# Patient Record
Sex: Female | Born: 1965
Health system: Southern US, Community
[De-identification: ages and names within clinical notes are randomized; demographics above are authoritative.]

## PROBLEM LIST (undated history)

## (undated) DIAGNOSIS — D219 Benign neoplasm of connective and other soft tissue, unspecified: Secondary | ICD-10-CM

## (undated) DIAGNOSIS — H9190 Unspecified hearing loss, unspecified ear: Secondary | ICD-10-CM

## (undated) HISTORY — DX: Benign neoplasm of connective and other soft tissue, unspecified: D21.9

## (undated) HISTORY — PX: DILATION AND CURETTAGE OF UTERUS: SHX78

## (undated) HISTORY — DX: Unspecified hearing loss, unspecified ear: H91.90

## (undated) HISTORY — PX: HYSTEROSCOPY: SHX211

## (undated) HISTORY — PX: PELVIC LAPAROSCOPY: SHX162

---

## 1998-09-02 ENCOUNTER — Ambulatory Visit (HOSPITAL_COMMUNITY): Admission: RE | Admit: 1998-09-02 | Discharge: 1998-09-02 | Payer: Self-pay | Admitting: Neurosurgery

## 1998-09-02 ENCOUNTER — Encounter: Payer: Self-pay | Admitting: Neurosurgery

## 1999-02-25 ENCOUNTER — Other Ambulatory Visit: Admission: RE | Admit: 1999-02-25 | Discharge: 1999-02-25 | Payer: Self-pay | Admitting: Obstetrics and Gynecology

## 2000-02-23 ENCOUNTER — Other Ambulatory Visit: Admission: RE | Admit: 2000-02-23 | Discharge: 2000-02-23 | Payer: Self-pay | Admitting: Obstetrics and Gynecology

## 2001-01-10 ENCOUNTER — Encounter: Payer: Self-pay | Admitting: Family Medicine

## 2001-01-10 ENCOUNTER — Encounter: Admission: RE | Admit: 2001-01-10 | Discharge: 2001-01-10 | Payer: Self-pay | Admitting: Family Medicine

## 2001-02-23 ENCOUNTER — Other Ambulatory Visit: Admission: RE | Admit: 2001-02-23 | Discharge: 2001-02-23 | Payer: Self-pay | Admitting: Obstetrics and Gynecology

## 2001-04-06 ENCOUNTER — Encounter: Payer: Self-pay | Admitting: Obstetrics and Gynecology

## 2001-04-06 ENCOUNTER — Ambulatory Visit (HOSPITAL_COMMUNITY): Admission: RE | Admit: 2001-04-06 | Discharge: 2001-04-06 | Payer: Self-pay | Admitting: Obstetrics and Gynecology

## 2001-06-18 ENCOUNTER — Encounter (INDEPENDENT_AMBULATORY_CARE_PROVIDER_SITE_OTHER): Payer: Self-pay | Admitting: *Deleted

## 2001-06-18 ENCOUNTER — Inpatient Hospital Stay (HOSPITAL_COMMUNITY): Admission: RE | Admit: 2001-06-18 | Discharge: 2001-06-20 | Payer: Self-pay | Admitting: Obstetrics and Gynecology

## 2001-06-18 HISTORY — PX: EXPLORATORY LAPAROTOMY: SUR591

## 2001-06-18 HISTORY — PX: TONSILLECTOMY: SUR1361

## 2002-04-05 ENCOUNTER — Other Ambulatory Visit: Admission: RE | Admit: 2002-04-05 | Discharge: 2002-04-05 | Payer: Self-pay | Admitting: Obstetrics and Gynecology

## 2003-04-10 ENCOUNTER — Other Ambulatory Visit: Admission: RE | Admit: 2003-04-10 | Discharge: 2003-04-10 | Payer: Self-pay | Admitting: Obstetrics and Gynecology

## 2003-12-31 ENCOUNTER — Ambulatory Visit (HOSPITAL_COMMUNITY): Admission: RE | Admit: 2003-12-31 | Discharge: 2003-12-31 | Payer: Self-pay | Admitting: Gynecology

## 2003-12-31 ENCOUNTER — Encounter (INDEPENDENT_AMBULATORY_CARE_PROVIDER_SITE_OTHER): Payer: Self-pay | Admitting: Specialist

## 2003-12-31 ENCOUNTER — Ambulatory Visit (HOSPITAL_BASED_OUTPATIENT_CLINIC_OR_DEPARTMENT_OTHER): Admission: RE | Admit: 2003-12-31 | Discharge: 2003-12-31 | Payer: Self-pay | Admitting: Gynecology

## 2004-04-20 ENCOUNTER — Other Ambulatory Visit: Admission: RE | Admit: 2004-04-20 | Discharge: 2004-04-20 | Payer: Self-pay | Admitting: Gynecology

## 2004-09-22 ENCOUNTER — Ambulatory Visit (HOSPITAL_COMMUNITY): Admission: RE | Admit: 2004-09-22 | Discharge: 2004-09-22 | Payer: Self-pay | Admitting: Gynecology

## 2004-09-22 ENCOUNTER — Encounter (INDEPENDENT_AMBULATORY_CARE_PROVIDER_SITE_OTHER): Payer: Self-pay | Admitting: Specialist

## 2004-09-22 ENCOUNTER — Ambulatory Visit (HOSPITAL_BASED_OUTPATIENT_CLINIC_OR_DEPARTMENT_OTHER): Admission: RE | Admit: 2004-09-22 | Discharge: 2004-09-22 | Payer: Self-pay | Admitting: Gynecology

## 2004-12-02 ENCOUNTER — Encounter (INDEPENDENT_AMBULATORY_CARE_PROVIDER_SITE_OTHER): Payer: Self-pay | Admitting: *Deleted

## 2004-12-02 ENCOUNTER — Ambulatory Visit (HOSPITAL_COMMUNITY): Admission: RE | Admit: 2004-12-02 | Discharge: 2004-12-02 | Payer: Self-pay | Admitting: Gynecology

## 2004-12-02 ENCOUNTER — Ambulatory Visit (HOSPITAL_BASED_OUTPATIENT_CLINIC_OR_DEPARTMENT_OTHER): Admission: RE | Admit: 2004-12-02 | Discharge: 2004-12-02 | Payer: Self-pay | Admitting: Gynecology

## 2005-04-15 ENCOUNTER — Ambulatory Visit (HOSPITAL_COMMUNITY): Admission: RE | Admit: 2005-04-15 | Discharge: 2005-04-15 | Payer: Self-pay | Admitting: Gynecology

## 2005-05-05 ENCOUNTER — Emergency Department (HOSPITAL_COMMUNITY): Admission: EM | Admit: 2005-05-05 | Discharge: 2005-05-05 | Payer: Self-pay | Admitting: Family Medicine

## 2005-06-16 ENCOUNTER — Other Ambulatory Visit: Admission: RE | Admit: 2005-06-16 | Discharge: 2005-06-16 | Payer: Self-pay | Admitting: Gynecology

## 2006-03-08 ENCOUNTER — Ambulatory Visit (HOSPITAL_COMMUNITY): Admission: RE | Admit: 2006-03-08 | Discharge: 2006-03-08 | Payer: Self-pay | Admitting: Obstetrics and Gynecology

## 2006-06-27 ENCOUNTER — Other Ambulatory Visit: Admission: RE | Admit: 2006-06-27 | Discharge: 2006-06-27 | Payer: Self-pay | Admitting: Gynecology

## 2006-06-28 ENCOUNTER — Encounter: Admission: RE | Admit: 2006-06-28 | Discharge: 2006-06-28 | Payer: Self-pay | Admitting: Gynecology

## 2006-12-14 ENCOUNTER — Encounter: Admission: RE | Admit: 2006-12-14 | Discharge: 2007-03-14 | Payer: Self-pay | Admitting: Orthopaedic Surgery

## 2007-01-11 ENCOUNTER — Other Ambulatory Visit: Admission: RE | Admit: 2007-01-11 | Discharge: 2007-01-11 | Payer: Self-pay | Admitting: Gynecology

## 2007-08-28 ENCOUNTER — Other Ambulatory Visit: Admission: RE | Admit: 2007-08-28 | Discharge: 2007-08-28 | Payer: Self-pay | Admitting: Gynecology

## 2007-12-04 ENCOUNTER — Encounter: Admission: RE | Admit: 2007-12-04 | Discharge: 2007-12-04 | Payer: Self-pay | Admitting: Family Medicine

## 2007-12-18 ENCOUNTER — Encounter: Admission: RE | Admit: 2007-12-18 | Discharge: 2007-12-18 | Payer: Self-pay | Admitting: Family Medicine

## 2008-01-21 ENCOUNTER — Ambulatory Visit (HOSPITAL_COMMUNITY): Admission: RE | Admit: 2008-01-21 | Discharge: 2008-01-21 | Payer: Self-pay | Admitting: Obstetrics and Gynecology

## 2008-02-01 ENCOUNTER — Emergency Department (HOSPITAL_COMMUNITY): Admission: EM | Admit: 2008-02-01 | Discharge: 2008-02-01 | Payer: Self-pay | Admitting: Family Medicine

## 2008-08-24 ENCOUNTER — Encounter: Admission: RE | Admit: 2008-08-24 | Discharge: 2008-08-24 | Payer: Self-pay | Admitting: Otolaryngology

## 2008-10-17 ENCOUNTER — Other Ambulatory Visit: Admission: RE | Admit: 2008-10-17 | Discharge: 2008-10-17 | Payer: Self-pay | Admitting: Gynecology

## 2008-10-17 ENCOUNTER — Encounter: Payer: Self-pay | Admitting: Gynecology

## 2008-10-17 ENCOUNTER — Ambulatory Visit: Payer: Self-pay | Admitting: Gynecology

## 2008-12-10 ENCOUNTER — Encounter: Admission: RE | Admit: 2008-12-10 | Discharge: 2008-12-10 | Payer: Self-pay | Admitting: Family Medicine

## 2008-12-15 ENCOUNTER — Emergency Department (HOSPITAL_COMMUNITY): Admission: EM | Admit: 2008-12-15 | Discharge: 2008-12-15 | Payer: Self-pay | Admitting: Family Medicine

## 2009-12-17 ENCOUNTER — Other Ambulatory Visit: Admission: RE | Admit: 2009-12-17 | Discharge: 2009-12-17 | Payer: Self-pay | Admitting: Gynecology

## 2009-12-17 ENCOUNTER — Ambulatory Visit: Payer: Self-pay | Admitting: Gynecology

## 2010-01-21 ENCOUNTER — Encounter: Admission: RE | Admit: 2010-01-21 | Discharge: 2010-01-21 | Payer: Self-pay | Admitting: Family Medicine

## 2011-01-02 ENCOUNTER — Encounter: Payer: Self-pay | Admitting: Gynecology

## 2011-01-05 ENCOUNTER — Other Ambulatory Visit: Payer: Self-pay | Admitting: *Deleted

## 2011-01-05 DIAGNOSIS — Z1239 Encounter for other screening for malignant neoplasm of breast: Secondary | ICD-10-CM

## 2011-01-18 ENCOUNTER — Other Ambulatory Visit (HOSPITAL_COMMUNITY)
Admission: RE | Admit: 2011-01-18 | Discharge: 2011-01-18 | Disposition: A | Discharge status: Home / Self Care | Payer: 59 | Source: Ambulatory Visit | Attending: Gynecology | Admitting: Gynecology

## 2011-01-18 ENCOUNTER — Other Ambulatory Visit: Payer: Self-pay | Admitting: Gynecology

## 2011-01-18 ENCOUNTER — Encounter: Payer: 59 | Admitting: Gynecology

## 2011-01-18 DIAGNOSIS — Z01419 Encounter for gynecological examination (general) (routine) without abnormal findings: Secondary | ICD-10-CM

## 2011-01-18 DIAGNOSIS — Z124 Encounter for screening for malignant neoplasm of cervix: Secondary | ICD-10-CM | POA: Insufficient documentation

## 2011-01-24 ENCOUNTER — Ambulatory Visit
Admission: RE | Admit: 2011-01-24 | Discharge: 2011-01-24 | Disposition: A | Discharge status: Home / Self Care | Payer: 59 | Source: Ambulatory Visit | Attending: *Deleted | Admitting: *Deleted

## 2011-01-24 DIAGNOSIS — Z1239 Encounter for other screening for malignant neoplasm of breast: Secondary | ICD-10-CM

## 2011-04-14 ENCOUNTER — Other Ambulatory Visit: Payer: Self-pay | Admitting: Family Medicine

## 2011-04-14 DIAGNOSIS — Z1239 Encounter for other screening for malignant neoplasm of breast: Secondary | ICD-10-CM

## 2011-04-26 NOTE — Op Note (Signed)
NAMEAVAH, BASHOR                 ACCOUNT NO.:  [id number]   MEDICAL RECORD NO.:  [id number]          PATIENT TYPE:  AMB   LOCATION:  SDC                           FACILITY:  [facility]   PHYSICIAN:  Fermin Schwab, MD   DATE OF BIRTH:  1966-09-25   DATE OF PROCEDURE:  01/21/2008  DATE OF DISCHARGE:                               OPERATIVE REPORT   PREOPERATIVE DIAGNOSIS:  Endometrial polyp.   POSTOPERATIVE DIAGNOSIS:  Endometrial polyp plus intrauterine adhesion.   PROCEDURE:  1. Hysteroscopy.  2. Polypectomy.  3. Lysis of adhesion.   SURGEON:  Fermin Schwab, MD   ANESTHESIA:  General LMA plus paracervical block.   FINDINGS:  The cervical canal was normal.  Endometrial canal appears  somewhat asymmetric due to dense but small adhesions in the left osteal  region of the fundus.  The right tubal osteum was seen, but the left  tubal osteum was not visualized even after lysis of the left side of the  adhesion.  There was a posterior 4 x 4 mm polypoid mass on the  endometrium.  The uterus sounded to 7 cm.   DESCRIPTION OF PROCEDURE:  The patient was placed in the lithotomy  position.  LMA anesthesia was started.  Kefzol 1 g was given  intravenously for prophylaxis.  The patient was prepped and draped in a  sterile manner.  Using 3% sorbitol and a fluid distention pump set at 80  mmHg pressure, video hysteroscopy was started, following paracervical  block with Lidocaine 1% and dilute (0.4 U/mL) vasopressin injection into  the cervix.  A slender 5mm hysteroscope was used.  Above findings were  noted.  No dilation was required.  Using hysteroscopic scissors, the  left fundal dense adhesion was lysed.  In spite of this, the left tubal  ostium could not be visualized (please note, this was the area of  previous hysteroscopic resection of the myoma).  The right tubal ostium  was noted.  Using hysteroscopic scissors, the posterior polypoid region  was also carved out, and a specimen was  submitted for pathology.  Estimated blood loss was less than 10 cc.  The fluid deficit was 150 cc.  The patient tolerated the procedure well and was transferred to the  recovery room in satisfactory condition.      Fermin Schwab, MD  Electronically Signed     TY/MEDQ  D:  01/21/2008  T:  01/22/2008  Job:  [id number]

## 2011-04-29 NOTE — Op Note (Signed)
Becky Brown, Becky Brown                 ACCOUNT NO.:  [id number]   MEDICAL RECORD NO.:  [id number]          PATIENT TYPE:  AMB   LOCATION:  N[facility]C                         FACILITY:  [facility]   PHYSICIAN:  Juan H. Lily Peer, M.D.DATE OF BIRTH:  Nov 27, 1966   DATE OF PROCEDURE:  09/22/2004  DATE OF DISCHARGE:                                 OPERATIVE REPORT   PREOPERATIVE DIAGNOSIS:  First trimester missed AB.   POSTOPERATIVE DIAGNOSIS:  First trimester missed abortion.   PROCEDURE:  Dilatation and evacuation.   SURGEON:  Juan H. Lily Peer, M.D.   ANESTHESIA:  MAC and paracervical block with 2% Xylocaine with 1:100,000  epinephrine.   INDICATIONS FOR PROCEDURE:  A 45 year old gravida 3, para 0, now AB 3 with  history of infertility and recurrent pregnancy losses.  Patient with first  trimester missed AB.   DESCRIPTION OF PROCEDURE:  After the patient was adequately counseled, she  was taken to the operating room where she underwent a successful IV  sedation. The vagina and perineum were prepped and draped in the usual  sterile fashion. The patient had previously voided right before coming to  the operating room.  Examination under anesthesia demonstrated an 8 weeks'  size anteverted uterus but no palpable masses.  A Graves' speculum was  inserted into the vaginal vault. The vagina and cervix were cleansed with  Betadine solution.  The patient had received 1 g of Mefoxin for prophylaxis.  Then 2% Xylocaine with 1:100,000 epinephrine was infiltrated into the  cervical stroma at the 2, 4, 8 and 10 o'clock positions.   The cervix was serially dilated to a size 21 Pratt dilator.  The uterus  sounded to approximately 8 cm. An 8 mm suction curet was introduced into the  intrauterine cavity for removal of the intrauterine products of conception  and this was interchanged with a Hunter curet to completely evacuate the  intrauterine cavity. The single-tooth tenaculum was removed.  The patient  tolerated the procedure well with no complications and was transferred to  recovery room with stable vital signs.  Blood loss was minimal and fluid  resuscitation consisted of 500 cc of lactated Ringer's.  The patient's blood  type is B positive.      JHF/MEDQ  D:  09/22/2004  T:  09/22/2004  Job:  [id number]

## 2011-04-29 NOTE — H&P (Signed)
NAMEDEMETRIA, IWAI                 ACCOUNT NO.:  [id number]   MEDICAL RECORD NO.:  [id number]          PATIENT TYPE:  AMB   LOCATION:  NESC                         FACILITY:  [facility]   PHYSICIAN:  Juan H. Lily Peer, M.D.DATE OF BIRTH:  30-Mar-1966   DATE OF ADMISSION:  DATE OF DISCHARGE:                                HISTORY & PHYSICAL   She is scheduled for surgery at [facility] tomorrow,  December the 22nd, at 9:30 a.m.  Please have history and physical available.   CHIEF COMPLAINT:  1.  Recurrent pregnancy loss.  2.  Suspected endometrial polyps/submucous myoma.   HISTORY:  The patient is a 45 year old, gravida 3, para 0, AB 3, who in  October of this year underwent a D&E for a first trimester missed AB.  On  November 28th she was seen in the office where she had a sonohysterogram as  part of her evaluation for recurrent pregnancy losses.  She had a prior  myomectomy in the past in 2002.  The patient recently had antiphospholipid  antibodies, IgG and IgM which were tested.  They were in the normal range.  Lupus anticoagulant was slightly in the upper limits of normal at 44.1  seconds, cut off again at 40.5 seconds.  The sonohysterogram on the 10th day  of her cycle demonstrated uterus measuring 6.3 x 3.0 x 4.0 cm with an  endometrial stripe of 10.1 mm.  Two myomas measuring 9 x 9 mm, 10 x 12 mm  were noted and the right ovary was noted as well as the left ovary.  No  fluid in the cul-de-sacs.  sonohysterogram demonstrated no filling defect,  but an encroaching myoma posterior wall measuring 1.0 cm.  I explained to  Rosaly that perhaps the problem with her recurrent pregnancy loss may be an  impingement from these myomas and that she would benefit from a diagnostic  hysteroscopy and possible resectoscopic myomectomy in an effort to improve  the implantation site for future pregnancies.  The patient has been on  Megace 20 mg b.i.d. for two weeks in an effort to thin  out the endometrium  for better visualization during the hysteroscopic procedure.  She also had a  laminaria placed endocervically today on December the 21st in the office,  where she underwent a preoperative consultation and risks, benefits and pros  and cons were discussed.   PAST MEDICAL HISTORY:  1.  The patient is allergic to ERYTHROMYCIN.  2.  History of fibroid uterus with prior history of myomectomy.  3.  Patient with prior history of tonsillectomy in the past and had been on      multivitamins.   FAMILY HISTORY:  Lung cancer in her father, history of grandmother with  diabetes and grandmother with hypertension and breast cancer in maternal  grandmother.   PHYSICAL EXAMINATION:  GENERAL:  Well-developed, well-nourished female.  HEENT:  Unremarkable.  NECK:  Supple.  Trachea midline.  No carotid bruits.  No thyromegaly.  LUNGS:  Clear to auscultation without rhonchi or wheezes.  HEART:  Regular rate and rhythm.  No murmurs or gallops.  BREASTS:  Not done.  ABDOMEN:  Soft, nontender.  No rebound or guarding.  PELVIC:  Bartholin, urethral, Skene gland within normal limits.  Vagina and  cervix no lesions or discharge.  Uterus slightly anteverted, normal size.  __________ consistency.  Adnexa without any palpable mass or tenderness.  RECTAL:  Not done.   ASSESSMENT:  This is a 45 year old, gravida 3, para 0, now AB 3, with  history of recurrent pregnancy losses with what appears to be impingement of  the endometrial cavity from several myomas.  She was scheduled to undergo a  diagnostic hysteroscopy with possible resection of part of or complete  submucous myomectomy in an effort to improve her pregnancy success next  time.  The patient's husband also suffers from retrograde ejaculation, for  which he has been under the care of Dr. Morrison Old at [facility].  The patient had a laminaria placed endocervically today in the office where  we had discussed the risks,  benefits and pros and cons of surgery to include  infection and she will receive prophylaxis antibiotic.  The risks of uterine  perforation from instrumentation were discussed as well, as well as fluid  overload and pulmonary edema from the descending media.  All of these issues  were discussed.  All questions were answered and we will follow accordingly.   PLAN:  The patient is scheduled for diagnostic hysteroscopy and possible  resectoscopic polypectomy/myomectomy tomorrow, December 22nd at [facility].     Juan   JHF/MEDQ  D:  12/01/2004  T:  12/01/2004  Job:  [id number]

## 2011-04-29 NOTE — H&P (Signed)
NAME:  Becky Brown, Becky Brown NO.:  [id number]   MEDICAL RECORD NO.:  [id number]                   PATIENT TYPE:   LOCATION:                                       FACILITY:   PHYSICIAN:  Juan H. Lily Peer, M.D.             DATE OF BIRTH:   DATE OF ADMISSION:  01/31/2004  DATE OF DISCHARGE:                                HISTORY & PHYSICAL   CHIEF COMPLAINT:  First trimester missed abortion.   HISTORY:  The patient is a 45 year old gravida I, para 0, AB 1 who presented  to the office today at [facility] as a new OB and also for an  ultrasound due to the fact that she had had long standing history of  infertility whereby she had had IUI and Clomid here in our office and  subsequently had been followed with Dr. Joylene Grapes at [facility] where she had undergone IUI, Clomid, and HCG and  had conceived after that one cycle. He had done an ultrasound in [facility], and on December 22 whereby two yolk sacs were seen but only heart  beat and thought perhaps that the other one had not developed enough to be  able to detect a heart beat. Based on her last menstrual period and timing  of the IUI (last menstrual period November 1), she is currently [redacted] weeks  gestation with estimated date of confinement of August 8. She had denied any  history of any vaginal bleeding, and unfortunately today on the ultrasound  in the office at [facility] on January 17, it demonstrated  nonviable intrauterine pregnancy with a blighted ovum, questionable three  amnions visualized of various dimensions, one measuring 23 mm, another one  measuring 19 mm, and another 11 mm. All the amnions were empty. No fetal  pole was noted, and no cardiac activity. Based on this finding, the patient  was counseled and recommended to proceed with a D&E at this point, and the  risks and benefits and pros and cons were discussed.   PAST MEDICAL HISTORY:   The patient is allergic to ERYTHROMYCIN. History of  fibroid uterus with prior history of myomectomy. She has also had  tonsillectomy in the past as well. She is on multivitamins and Sudafed  p.r.n.   FAMILY HISTORY:  Lung cancer in her father and history of grandmother with  diabetes. Grandmother with hypertension and breast cancer in maternal  grandmother.   PHYSICAL EXAMINATION:  GENERAL:  The patient weighs 161 pounds, 5 feet 5-1/2  inches tall. Blood pressure 114/68.  HEENT:  Unremarkable.  NECK:  Supple. Trachea midline. No carotid bruits. No thyromegaly.  LUNGS:  Clear to auscultation without rhonchi or wheezes.  HEART:  Regular rate and rhythm with no murmurs or gallops.  BREAST:  Exam not done.  ABDOMEN:  Soft, nontender, without rebound or guarding.  PELVIC:  _________ within normal limits. Pelvic examination essentially  unremarkable. Uterus approximately 10 weeks' size. No palpable adnexal  masses.  RECTAL:  Exam deferred.   ASSESSMENT:  A 45 year old now gravida 2, para 0, AB 1 with evidence of  first trimester missed AB is scheduled to undergo a D&E at [facility]. Risks, benefits, and pros and cons of the procedure were  discussed to include infection, bleeding, trauma to internal organs,  perforation, and a D&E were discussed. The patient will receive a gram of  cefepime for prophylaxis. All of the above risks were discussed with the  patient. All questions were answered.   PLAN:  The patient scheduled for first trimester D&E at [facility] on Wednesday, January 19, at 8:30 a.m. The patient's blood type is B  positive.                                               Juan H. Lily Peer, M.D.    JHF/MEDQ  D:  12/30/2003  T:  12/30/2003  Job:  [id number]

## 2011-04-29 NOTE — H&P (Signed)
[facility]  Patient:    Becky Brown, Becky Brown                         MRN: [id number] Adm. Date:  06/18/01 Attending:  Rande Brunt. Eda Paschal, M.D.                         History and Physical  CHIEF COMPLAINT:  Menometrorrhagia with leiomyoma uteri.  HISTORY OF PRESENT ILLNESS:  The patient is a 45 year old, gravida 1, para 0, AB 1, who presented to the office in April with a history of persistent menorrhagia, menometrorrhagia, and severe dysmenorrhea in spite of a variety of different oral contraceptives in order to control it.  On exam she has multiple myomas, the largest of which is 5 cm and appears to encroach on the endometrial cavity.  There is also some question that she actually has intracavitary pathology.  As a result of the above and also the fact that at some point the patient would like to have children, she enters the hospital now for multiple myectomies, excision of endometriosis or other pathology found to treat the above.  She understands there is a small, but real risk of hysterectomy as a result of the surgery, and she is comfortable with accepting for the benefits.  PAST MEDICAL HISTORY:  T&A as a child.  MEDICATIONS:  Septra DS and Demulen.  ALLERGIES:  ERYTHROMYCIN.  FAMILY HISTORY:  Grandmother had breast cancer as well as diabetes. Grandmother and her mother have hypertension.  REVIEW OF SYSTEMS:  HEENT:  Negative.  CARDIAC:  Negative.  RESPIRATORY: Negative.  GI:  Negative.  GU:  Negative.  NEUROMUSCULAR:  Negative. ENDOCRINE:  Negative.  SOCIAL HISTORY:  She is an Charity fundraiser at [employer].  She has one to two glasses of wine per month.  She does not smoke.  She uses caffeine occasionally.  PHYSICAL EXAMINATION:  VITAL SIGNS:  Blood pressure is 116/74, pulse 80 and regular,  respirations 16 and nonlabored, and she is afebrile.  GENERAL:  The patient is a well-developed, well-nourished female in no acute distress.  HEENT:   Within normal limits.  NECK:  Supple, trachea in midline.  Thyroid is not enlarged.  LUNGS:  Clear to auscultation and percussion.  HEART:  No thrills, heaves or murmurs.  BREASTS:  No masses.  ABDOMEN:  Soft without guarding, rebound, or masses.  PELVIC:  External and vaginal within normal limits.  Cervix is clean.  Uterus is enlarged by multiple myoma to approximately 8-10 week size.  Adnexa palpably normal.  Rectal is negative.  EXTREMITIES:  Within normal limits.  X-RAYS:  Please note on HSG the patient does not have an intracavitary lesion per se, although there appears to be something encroaching on it.  On ultrasound she has multiple myomas, the largest is 5 cm, and she has three additional that are between 2 and 3 cm.   IMPRESSION:  Menometrorrhagia and dysmenorrhea with myomas.  PLAN:  Myomectomy. DD:  06/18/01 TD:  06/18/01 Job: [id number]

## 2011-04-29 NOTE — H&P (Signed)
NAMEDELIAH, Becky Brown                 ACCOUNT NO.:  [id number]   MEDICAL RECORD NO.:  [id number]          PATIENT TYPE:  AMB   LOCATION:  NESC                         FACILITY:  [facility]   PHYSICIAN:  Juan H. Lily Peer, M.D.DATE OF BIRTH:  [birth year]   DATE OF ADMISSION:  DATE OF DISCHARGE:                                HISTORY & PHYSICAL   CHIEF COMPLAINT:  First trimester missed AB.   HISTORY:  The patient is a 45 year old, gravida 3, para 0, now AB 3, who had  conceived with this pregnancy as a result of IUI and Clomid and had been  followed by Dr. Morrison Old at [facility].  The patient had been seen in the office in [facility] for follow  up ultrasound after her conception, and she had an ultrasound September 7,  which demonstrated a viable intrauterine pregnancy at 7 weeks and with a  single viable intrauterine pregnancy, and a yolk sac was seen.  She  subsequently returned back to the office complaining of some vaginal  spotting on October 7 and unfortunately, there was evidence of a missed AB.  No cardiac activity was noted, [redacted] weeks gestational sac, and small corpus  luteum cyst on the left ovary measuring 15 x 11 mm.  The patient had more of  a brownish discharge.  She had been on progesterone suppository for luteal  support during her first pregnancy as well as with this one but since the  serum progesterone level was done in Dr. Meridee Score office and was in the  normal range, she discontinued the serum progesterone level.  The patient is  scheduled to undergo a D&E tomorrow, October 12, at [facility].   PAST MEDICAL HISTORY:  1.  The patient is allergic to ERYTHROMYCIN.  2.  History of fibroid uterus with prior history of myomectomy.  3.  She has had a tonsillectomy in the past, and she had been on      multivitamins.   FAMILY HISTORY:  Lung cancer in her father and history of grandmother with  diabetes.  Grandmother  with hypertension and breast cancer in a maternal  grandmother.   PHYSICAL EXAMINATION:  GENERAL:  Well-developed, well-nourished female with  the above-mentioned complaint.  HEENT:  Unremarkable.  NECK:  Supple.  Trachea midline.  No carotid bruits.  No thyromegaly.  LUNGS:  Clear to auscultation without rhonchi or wheezes.  HEART:  Regular rate and rhythm.  No murmurs or gallops.  BREAST:  Exam not done.  ABDOMEN:  Soft, nontender, without rebound or guarding.  PELVIC EXAM:  Bartholin, urethra, Skene's within normal limits.  Vagina and  cervix with brownish discharge noted in the vaginal vault.  Cervix not  dilated.  Uterus approximately 8 weeks' size.  RECTAL EXAM:  Not done.   ASSESSMENT:  A 45 year old gravida 3, para 0, now AB 3, with first trimester  missed AB.  Based on last menstrual period, the patient would be  approximately [redacted] weeks gestation.  Based on pelvic exam, 8-10 weeks' size  uterus and on ultrasound, a  nonviable fetus at 7-1/[redacted] weeks gestation was  noted.  The patient scheduled to undergo a D&E at [facility]  tomorrow, October 12.  Risks, benefits, pros and cons discussed to include  infection, bleeding, trauma to internal organ, perforation of the uterus,  risk of deep venous thrombosis, pulmonary embolism, and emergency  exploratory laparotomy and hysterectomy were also discussed as potential  complication from the procedure.  The patient will then be followed up in  the office where she will need to have continued evaluation now of recurrent  pregnancy losses besides her infertility issues.  They are well-delineated  in the patient's record at [facility].  The patient's  blood type is B positive.  All questions are answered, and we will follow  accordingly.   PLAN:  The patient scheduled for D&E, tomorrow, October 12, at [facility].  Please have history and physical available.      JHF/MEDQ  D:   09/21/2004  T:  09/21/2004  Job:  [id number]

## 2011-04-29 NOTE — Discharge Summary (Signed)
[facility]  Patient:    Becky Brown, Becky Brown                         MRN: [id number] Adm. Date:  [id number] Disc. Date: 06/20/01 Attending:  Sharon Mt                           Discharge Summary  HISTORY OF PRESENT ILLNESS AND HOSPITAL COURSE:  The patient is a 45 year old female who is admitted to the hospital with symptomatic fibroids and menorrhagia for multiple myomectomies.  On the day of admission multiple myomectomies were performed.  By the second postoperative day she was voiding well, tolerating oral pain medications, and although she had not passed gas, had a flat abdomen and appeared to be on the verge of doing so.  She was discharged on full liquids to advance as tolerated.  She was given Tylox for pain relief.  She will return to the office in two days for staple removal. Final pathology report revealed multiple benign leiomyoma.  CONDITION ON DISCHARGE:  Improved.  DISCHARGE DIAGNOSES: 1. Multiple myomas. 2. Menorrhagia.  OPERATIONS:  Multiple myomectomies. DD:  06/20/01 TD:  06/20/01 Job: [id number]

## 2011-04-29 NOTE — Op Note (Signed)
NAMEJASMEN, Becky Brown                            ACCOUNT NO.:  [id number]   MEDICAL RECORD NO.:  [id number]                   PATIENT TYPE:  AMB   LOCATION:  NESC                                 FACILITY:  [facility]   PHYSICIAN:  Juan H. Lily Peer, M.D.             DATE OF BIRTH:  Apr 11, 1966   DATE OF PROCEDURE:  12/31/2003  DATE OF DISCHARGE:                                 OPERATIVE REPORT   SURGEON:  Juan H. Lily Peer, M.D.   INDICATION FOR OPERATION:  A 45 year old, gravida 1, para 0, now AB1with  evidence of first trimester missed AB.   PREOPERATIVE DIAGNOSIS:  First trimester missed AB.   POSTOPERATIVE DIAGNOSIS:  First trimester missed AB.   ANESTHESIA:  MAC and paracervical block, consisting of 2% lidocaine.   PROCEDURE PERFORMED:  D&E.   FINDINGS:  Exam under anesthesia demonstrated a 10 week size uterus with no  palpable adnexal masses.   DESCRIPTION OF OPERATION:  After the patient was adequately counseled, she  was taken to the operating room where she underwent successful IV sedation.  She was placed in low lithotomy position.  The vagina and perineum were  prepped and draped in the usual sterile fashion.  A red rubber Roxan Hockey was  inserted in an effort to evacuate her bladder contents for approximately 50  mL.  Xylocaine 2% was then infiltrated into the cervical stroma at the 2, 4,  8, and 10 o'clock position.  The cervix was dilated with a Pratt dilator to  a size 25, and a 10 mm suction curette was introduced into the intrauterine  cavity to remove the remaining products of conception.  This was  interchanged with a suction curette.  An aliquot of the sample was submitted  for chromosomal studies.  The single-tooth tenaculum was removed.  Silver  nitrate was placed at the area where the prongs were because of a little bit  of bleeding but was contained without any difficulty.  The patient was  transferred to recovery room with stable vital signs, and she received 1  g  of Cefotan prophylactically, and she received 30 mg of Toradol en route to  the recovery room.  Blood loss was minimal.  Fluid resuscitation consisted  of approximately 900 mL of lactated Ringer's.                                               Juan H. Lily Peer, M.D.    JHF/MEDQ  D:  12/31/2003  T:  12/31/2003  Job:  [id number]

## 2011-04-29 NOTE — Op Note (Signed)
[facility]  Patient:    Becky Brown, Becky Brown                         MRN: [id number] Proc. Date: 06/18/01 Adm. Date:  [id number] Attending:  Sharon Mt                           Operative Report  PREOPERATIVE DIAGNOSIS:  Multiple myomas with dysfunctional uterine bleeding and dysmenorrhea.  POSTOPERATIVE DIAGNOSIS:  Multiple myomas with dysfunctional uterine bleeding and dysmenorrhea.  OPERATION:  Exploratory laparotomy with multiple myomectomy.  SURGEON:  Daniel L. Eda Paschal, M.D.  FIRST ASSISTANT:  Katy Fitch, M.D.  ANESTHESIA:  General endotracheal.  FINDINGS:  Patient had approximately seven myomas.  They started with two submucous myomas of less than 0.5 cm.  There was one that was in the wall of the uterus of approximately 2.5 to 3.0 cm that abutted the endometrial cavity. There were several that were more subserosal or almost pedunculated, one of which was in very close approximation to the left fallopian tube; these were approximately 4 and 5 cm each.  The fallopian tubes, ovaries, pelvic peritoneum were free of any disease and there was no endometriosis specifically.  The fimbriae of the tubes looked normal and they were luxuriant.  DESCRIPTION OF PROCEDURE:  After adequate general endotracheal anesthesia, the patient was placed in supine position, prepped and draped in the usual sterile manner, a Foley catheter was inserted in the patients bladder and a Jarcho cannula was inserted into the uterus to inject dye.  A Pfannenstiel incision was made, the fascia was opened transverse, the peritoneum was entered vertically, the subcutaneous bleeders were clamped and bovied as encountered. When the peritoneal cavity was opened, the above findings were noted.  An attempt was made to inject dye but there was a fair bit of clot in her uterus from her episode of menometrorrhagia and no dye could be seen leaving the tubes, although she had  had a normal HSG within the past month.  A dilute solution of Pitressin was injected and two incisions were needed to remove all the myomas.  The first one was in the midline of the uterus vertically, staying away from the fallopian tubes.  This was taken very deep into the myometrium and the large myoma that abutted the endometrial cavity was removed.  At this point, the endometrial cavity was then entered and the two submucous myomas, which were small, were removed and then this incision was closed.  The endometrium was reapproximated with 4-0 Vicryl, the myometrium was approximated with a running 0 Vicryl and the serosa of the uterus was closed with 4-0 Prolene.  The remainder three to four myomas were removed through a second incision at the top of the fundus, taking exceeding care to stay away from the tube, which could be done.  None of them were particular deep and there was no myometrium that had to be reapproximated, so the serosa was then closed with a running 4-0 Prolene.  Copious irrigation was done with Ringers lactate.  Two sponge, needle and instrument counts were correct.  The peritoneal cavity was closed with a running 0 Vicryl.  Prior to completely closing it, about 300 cc of Ringers lactate were placed to allow the pelvic structures to float until everything was healed.  The fascia was closed with two running 0 Vicryl and the skin was closed with staples.  Estimated  blood loss for the entire procedure was 300 cc with none replaced.  Patient tolerated procedure well and left the operating room in satisfactory condition, draining clear urine from her Foley catheter. DD:  06/18/01 TD:  06/18/01 Job: [id number]

## 2011-04-29 NOTE — Op Note (Signed)
Becky Brown, SUPAK                 ACCOUNT NO.:  [id number]   MEDICAL RECORD NO.:  [id number]          PATIENT TYPE:  AMB   LOCATION:  [facility]                         FACILITY:  [facility]   PHYSICIAN:  Juan H. Lily Peer, M.D.DATE OF BIRTH:  [birth year]   DATE OF PROCEDURE:  12/02/2004  DATE OF DISCHARGE:                                 OPERATIVE REPORT   A 45 year old gravida 3, para 0, AB 3 with a history of recurrent pregnancy  losses.  On sonohystogram, questionable endometrial polyp versus submucus  myoma.  Patient with prior history of abdominal myomectomy.   PREOPERATIVE DIAGNOSES:  1.  Recurrent pregnancy loss.  2.  Submucus myoma versus endometrial polyps.   POSTOPERATIVE DIAGNOSIS:  Endometrial polyp.   SURGEON:  Juan H. Lily Peer, M.D.   PROCEDURE PERFORMED:  1.  Diagnostic hysteroscopy.  2.  Resectoscopic polypectomy.   FINDINGS:  Multiple small polyps throughout the uterine cavity.  A small  area of where a Prolene suture was seen from previous myomectomy.  Right  tubal ostia was identified, and the left tubal ostia was identified after  removal of polyps.  Endocervical canal was smooth.   DESCRIPTION OF PROCEDURE:  After the patient was adequately counseled, she  was taken to the operating room, where she received 1 gm of Cefotan for  prophylaxis.  She underwent successful general endotracheal anesthesia.  Her  vagina and perineum were prepped and draped in the usual sterile fashion.  Bimanual examination demonstrated a slightly retroverted uterus.  She had  Laminaria placed the night before in an effort to facilitate insertion of  the operative hysteroscope today and requiring minimal cervical dilatation.  The uterus sounded to approximately 7 cm.  A single-tooth tenaculum was  placed on the anterior cervical lip, and the Lendell Caprice operative  resectoscope with a 90 degree wire loop was inserted into the intrauterine  cavity.  Sorbitol 3% was the distended media that  was utilized.  The  endocervical canal was smooth.  No complications on entering the  intrauterine cavity.  The right tubal ostia was identified.  The left tubal  ostial had several polyps near the upper one-third portion of the uterus,  which was resected.  A small area where a Prolene suture was seen in the  right anterior portion of the uterus where a prior myomectomy may have been  done.  After saving some of the polyps, there was some bleeding that was  individually cauterized.  The instrument was removed.  A 30 cc Foley  catheter was inserted into the intrauterine cavity but inflated to 20 cc to  allow for a tamponade effect into the uterus.  The single-tooth tenaculum  was removed.  The patient was extubated and transferred to the recovery room  for stable vital signs.  Fluid deficit from the distending media was 325 cc.  Blood loss was less than 100 cc.  IV fluids was 700 cc of lactated Ringer's.    Juan  JHF/MEDQ  D:  12/02/2004  T:  12/02/2004  Job:  [id number]

## 2011-09-02 LAB — URINALYSIS, ROUTINE W REFLEX MICROSCOPIC
Ketones, ur: NEGATIVE
Leukocytes, UA: NEGATIVE
Protein, ur: NEGATIVE
Urobilinogen, UA: 0.2
pH: 5.5

## 2011-09-02 LAB — CBC
HCT: 35.3 — ABNORMAL LOW
Hemoglobin: 12.2
MCHC: 34.6
MCV: 91.9
Platelets: 279
RDW: 13.2
WBC: 5.6

## 2011-09-02 LAB — URINE MICROSCOPIC-ADD ON

## 2012-01-12 DIAGNOSIS — H905 Unspecified sensorineural hearing loss: Secondary | ICD-10-CM | POA: Insufficient documentation

## 2012-01-12 DIAGNOSIS — H903 Sensorineural hearing loss, bilateral: Secondary | ICD-10-CM | POA: Insufficient documentation

## 2012-01-12 DIAGNOSIS — H9319 Tinnitus, unspecified ear: Secondary | ICD-10-CM | POA: Insufficient documentation

## 2012-02-10 ENCOUNTER — Other Ambulatory Visit: Payer: Self-pay | Admitting: Gynecology

## 2012-02-10 DIAGNOSIS — Z1231 Encounter for screening mammogram for malignant neoplasm of breast: Secondary | ICD-10-CM

## 2012-02-16 ENCOUNTER — Ambulatory Visit
Admission: RE | Admit: 2012-02-16 | Discharge: 2012-02-16 | Disposition: A | Discharge status: Home / Self Care | Payer: 59 | Source: Ambulatory Visit | Attending: Gynecology | Admitting: Gynecology

## 2012-02-16 DIAGNOSIS — Z1231 Encounter for screening mammogram for malignant neoplasm of breast: Secondary | ICD-10-CM

## 2012-03-28 ENCOUNTER — Encounter: Payer: 59 | Admitting: Gynecology

## 2012-04-04 ENCOUNTER — Ambulatory Visit (INDEPENDENT_AMBULATORY_CARE_PROVIDER_SITE_OTHER): Payer: 59 | Admitting: Gynecology

## 2012-04-04 ENCOUNTER — Encounter: Payer: Self-pay | Admitting: Gynecology

## 2012-04-04 VITALS — BP 128/88 | Ht 65.75 in | Wt 192.0 lb

## 2012-04-04 DIAGNOSIS — Z01419 Encounter for gynecological examination (general) (routine) without abnormal findings: Secondary | ICD-10-CM

## 2012-04-04 DIAGNOSIS — R635 Abnormal weight gain: Secondary | ICD-10-CM

## 2012-04-04 NOTE — Patient Instructions (Signed)
Exercise to Lose Weight Exercise and a healthy diet may help you lose weight. Your doctor may suggest specific exercises. EXERCISE IDEAS AND TIPS  Choose low-cost things you enjoy doing, such as walking, bicycling, or exercising to workout videos.   Take stairs instead of the elevator.   Walk during your lunch break.   Park your car further away from work or school.   Go to a gym or an exercise class.   Start with 5 to 10 minutes of exercise each day. Build up to 30 minutes of exercise 4 to 6 days a week.   Wear shoes with good support and comfortable clothes.   Stretch before and after working out.   Work out until you breathe harder and your heart beats faster.   Drink extra water when you exercise.   Do not do so much that you hurt yourself, feel dizzy, or get very short of breath.  Exercises that burn about 150 calories:  Running 1  miles in 15 minutes.   Playing volleyball for 45 to 60 minutes.   Washing and waxing a car for 45 to 60 minutes.   Playing touch football for 45 minutes.   Walking 1  miles in 35 minutes.   Pushing a stroller 1  miles in 30 minutes.   Playing basketball for 30 minutes.   Raking leaves for 30 minutes.   Bicycling 5 miles in 30 minutes.   Walking 2 miles in 30 minutes.   Dancing for 30 minutes.   Shoveling snow for 15 minutes.   Swimming laps for 20 minutes.   Walking up stairs for 15 minutes.   Bicycling 4 miles in 15 minutes.   Gardening for 30 to 45 minutes.   Jumping rope for 15 minutes.   Washing windows or floors for 45 to 60 minutes.  Document Released: 12/31/2010 Document Revised: 08/10/2011 Document Reviewed: 12/31/2010 [employer] Patient Information 2012 [employer].                                                  Cholesterol Control Diet  Cholesterol levels in your body are determined significantly by your diet. Cholesterol levels may also be related to heart disease. The following material helps to  explain this relationship and discusses what you can do to help keep your heart healthy. Not all cholesterol is bad. Low-density lipoprotein (LDL) cholesterol is the "bad" cholesterol. It may cause fatty deposits to build up inside your arteries. High-density lipoprotein (HDL) cholesterol is "good." It helps to remove the "bad" LDL cholesterol from your blood. Cholesterol is a very important risk factor for heart disease. Other risk factors are high blood pressure, smoking, stress, heredity, and weight. The heart muscle gets its supply of blood through the coronary arteries. If your LDL cholesterol is high and your HDL cholesterol is low, you are at risk for having fatty deposits build up in your coronary arteries. This leaves less room through which blood can flow. Without sufficient blood and oxygen, the heart muscle cannot function properly and you may feel chest pains (angina pectoris). When a coronary artery closes up entirely, a part of the heart muscle may die, causing a heart attack (myocardial infarction). CHECKING CHOLESTEROL When your caregiver sends your blood to a lab to be analyzed for cholesterol, a complete lipid (fat) profile may be done. With   this test, the total amount of cholesterol and levels of LDL and HDL are determined. Triglycerides are a type of fat that circulates in the blood and can also be used to determine heart disease risk. The list below describes what the numbers should be: Test: Total Cholesterol.  Less than 200 mg/dl.  Test: LDL "bad cholesterol."  Less than 100 mg/dl.   Less than 70 mg/dl if you are at very high risk of a heart attack or sudden cardiac death.  Test: HDL "good cholesterol."  Greater than 50 mg/dl for women.   Greater than 40 mg/dl for men.  Test: Triglycerides.  Less than 150 mg/dl.  CONTROLLING CHOLESTEROL WITH DIET Although exercise and lifestyle factors are important, your diet is key. That is because certain foods are known to raise  cholesterol and others to lower it. The goal is to balance foods for their effect on cholesterol and more importantly, to replace saturated and trans fat with other types of fat, such as monounsaturated fat, polyunsaturated fat, and omega-3 fatty acids. On average, a person should consume no more than 15 to 17 g of saturated fat daily. Saturated and trans fats are considered "bad" fats, and they will raise LDL cholesterol. Saturated fats are primarily found in animal products such as meats, butter, and cream. However, that does not mean you need to sacrifice all your favorite foods. Today, there are good tasting, low-fat, low-cholesterol substitutes for most of the things you like to eat. Choose low-fat or nonfat alternatives. Choose round or loin cuts of red meat, since these types of cuts are lowest in fat and cholesterol. Chicken (without the skin), fish, veal, and ground turkey breast are excellent choices. Eliminate fatty meats, such as hot dogs and salami. Even shellfish have little or no saturated fat. Have a 3 oz (85 g) portion when you eat lean meat, poultry, or fish. Trans fats are also called "partially hydrogenated oils." They are oils that have been scientifically manipulated so that they are solid at room temperature resulting in a longer shelf life and improved taste and texture of foods in which they are added. Trans fats are found in stick margarine, some tub margarines, cookies, crackers, and baked goods.  When baking and cooking, oils are an excellent substitute for butter. The monounsaturated oils are especially beneficial since it is believed they lower LDL and raise HDL. The oils you should avoid entirely are saturated tropical oils, such as coconut and palm.  Remember to eat liberally from food groups that are naturally free of saturated and trans fat, including fish, fruit, vegetables, beans, grains (barley, rice, couscous, bulgur wheat), and pasta (without cream sauces).  IDENTIFYING  FOODS THAT LOWER CHOLESTEROL  Soluble fiber may lower your cholesterol. This type of fiber is found in fruits such as apples, vegetables such as broccoli, potatoes, and carrots, legumes such as beans, peas, and lentils, and grains such as barley. Foods fortified with plant sterols (phytosterol) may also lower cholesterol. You should eat at least 2 g per day of these foods for a cholesterol lowering effect.  Read package labels to identify low-saturated fats, trans fats free, and low-fat foods at the supermarket. Select cheeses that have only 2 to 3 g saturated fat per ounce. Use a heart-healthy tub margarine that is free of trans fats or partially hydrogenated oil. When buying baked goods (cookies, crackers), avoid partially hydrogenated oils. Breads and muffins should be made from whole grains (whole-wheat or whole oat flour, instead of "flour" or "  enriched flour"). Buy non-creamy canned soups with reduced salt and no added fats.  FOOD PREPARATION TECHNIQUES  Never deep-fry. If you must fry, either stir-fry, which uses very little fat, or use non-stick cooking sprays. When possible, broil, bake, or roast meats, and steam vegetables. Instead of dressing vegetables with butter or margarine, use lemon and herbs, applesauce and cinnamon (for squash and sweet potatoes), nonfat yogurt, salsa, and low-fat dressings for salads.  LOW-SATURATED FAT / LOW-FAT FOOD SUBSTITUTES Meats / Saturated Fat (g)  Avoid: Steak, marbled (3 oz/85 g) / 11 g   Choose: Steak, lean (3 oz/85 g) / 4 g   Avoid: Hamburger (3 oz/85 g) / 7 g   Choose: Hamburger, lean (3 oz/85 g) / 5 g   Avoid: Ham (3 oz/85 g) / 6 g   Choose: Ham, lean cut (3 oz/85 g) / 2.4 g   Avoid: Chicken, with skin, dark meat (3 oz/85 g) / 4 g   Choose: Chicken, skin removed, dark meat (3 oz/85 g) / 2 g   Avoid: Chicken, with skin, light meat (3 oz/85 g) / 2.5 g   Choose: Chicken, skin removed, light meat (3 oz/85 g) / 1 g  Dairy / Saturated Fat  (g)  Avoid: Whole milk (1 cup) / 5 g   Choose: Low-fat milk, 2% (1 cup) / 3 g   Choose: Low-fat milk, 1% (1 cup) / 1.5 g   Choose: Skim milk (1 cup) / 0.3 g   Avoid: Hard cheese (1 oz/28 g) / 6 g   Choose: Skim milk cheese (1 oz/28 g) / 2 to 3 g   Avoid: Cottage cheese, 4% fat (1 cup) / 6.5 g   Choose: Low-fat cottage cheese, 1% fat (1 cup) / 1.5 g   Avoid: Ice cream (1 cup) / 9 g   Choose: Sherbet (1 cup) / 2.5 g   Choose: Nonfat frozen yogurt (1 cup) / 0.3 g   Choose: Frozen fruit bar / trace   Avoid: Whipped cream (1 tbs) / 3.5 g   Choose: Nondairy whipped topping (1 tbs) / 1 g  Condiments / Saturated Fat (g)  Avoid: Mayonnaise (1 tbs) / 2 g   Choose: Low-fat mayonnaise (1 tbs) / 1 g   Avoid: Butter (1 tbs) / 7 g   Choose: Extra light margarine (1 tbs) / 1 g   Avoid: Coconut oil (1 tbs) / 11.8 g   Choose: Olive oil (1 tbs) / 1.8 g   Choose: Corn oil (1 tbs) / 1.7 g   Choose: Safflower oil (1 tbs) / 1.2 g   Choose: Sunflower oil (1 tbs) / 1.4 g   Choose: Soybean oil (1 tbs) / 2.4 g   Choose: Canola oil (1 tbs) / 1 g  Document Released: 11/28/2005 Document Revised: 08/10/2011 Document Reviewed: 05/19/2011 [employer] Patient Information 2012 [employer].   

## 2012-04-04 NOTE — Progress Notes (Signed)
Becky Brown [birth year] [id number]   History:    46 y.o.  for annual exam with no complaints reported. Review of her record indicated that she has gained 3 pounds from last year and has a BMI of 31.23. Patient her mammogram in March of this year which was normal and she frequently does her self breast examination. Dr. Manus Gunning history internist and has done her lab work within the past year. She's not using any form of contraception is having normal menstrual cycles. Past medical history,surgical history, family history and social history were all reviewed and documented in the EPIC chart.  Gynecologic History Patient's last menstrual period was 03/26/2012. Contraception: none Last Pap: 2012. Results were: normal Last mammogram: 2013. Results were: normal  Obstetric History OB History    Grav Para Term Preterm Abortions TAB SAB Ect Mult Living   3 0   3  3   0     # Outc Date GA Lbr Len/2nd Wgt Sex Del Anes PTL Lv   1 SAB            2 SAB            3 SAB                ROS:  Was performed and pertinent positives and negatives are included in the history.  Exam: chaperone present  BP 128/88  Ht 5' 5.75" (1.67 m)  Wt 192 lb (87.091 kg)  BMI 31.23 kg/m2  LMP 03/26/2012  Body mass index is 31.23 kg/(m^2).  General appearance : Well developed well nourished female. No acute distress HEENT: Neck supple, trachea midline, no carotid bruits, no thyroidmegaly Lungs: Clear to auscultation, no rhonchi or wheezes, or rib retractions  Heart: Regular rate and rhythm, no murmurs or gallops Breast:Examined in sitting and supine position were symmetrical in appearance, no palpable masses or tenderness,  no skin retraction, no nipple inversion, no nipple discharge, no skin discoloration, no axillary or supraclavicular lymphadenopathy Abdomen: no palpable masses or tenderness, no rebound or guarding Extremities: no edema or skin discoloration or tenderness  Pelvic:  Bartholin, Urethra, Skene  Glands: Within normal limits             Vagina: No gross lesions or discharge  Cervix: No gross lesions or discharge  Uterus  anteverted, normal size, shape and consistency, non-tender and mobile  Adnexa  Without masses or tenderness  Anus and perineum  normal   Rectovaginal  normal sphincter tone without palpated masses or tenderness             Hemoccult not done     Assessment/Plan:  46 y.o. female for annual exam who was encouraged to do her monthly self breast examination. We discussed importance of calcium vitamin D for osteoporosis prevention as well as weightbearing exercises. She stated that she was 40% of her hearing in her right ear after the H1 in 1 vaccine. We discussed the new Pap tear screening guidelines and she will not need 1 for 2 more years. Her labs were being drawn by her primary physician so no lab work will be drawn today.    Ok Edwards MD, 12:02 PM 04/04/2012

## 2013-02-25 ENCOUNTER — Other Ambulatory Visit: Payer: Self-pay

## 2013-02-25 DIAGNOSIS — Z1231 Encounter for screening mammogram for malignant neoplasm of breast: Secondary | ICD-10-CM

## 2013-03-15 ENCOUNTER — Ambulatory Visit
Admission: RE | Admit: 2013-03-15 | Discharge: 2013-03-15 | Disposition: A | Discharge status: Home / Self Care | Payer: 59 | Source: Ambulatory Visit

## 2013-03-15 DIAGNOSIS — Z1231 Encounter for screening mammogram for malignant neoplasm of breast: Secondary | ICD-10-CM

## 2013-04-18 ENCOUNTER — Other Ambulatory Visit (HOSPITAL_COMMUNITY)
Admission: RE | Admit: 2013-04-18 | Discharge: 2013-04-18 | Disposition: A | Discharge status: Home / Self Care | Payer: 59 | Source: Ambulatory Visit | Attending: Gynecology | Admitting: Gynecology

## 2013-04-18 ENCOUNTER — Ambulatory Visit (INDEPENDENT_AMBULATORY_CARE_PROVIDER_SITE_OTHER): Payer: 59 | Admitting: Gynecology

## 2013-04-18 ENCOUNTER — Encounter: Payer: Self-pay | Admitting: Gynecology

## 2013-04-18 VITALS — BP 130/86 | Ht 64.5 in | Wt 192.0 lb

## 2013-04-18 DIAGNOSIS — Z01419 Encounter for gynecological examination (general) (routine) without abnormal findings: Secondary | ICD-10-CM

## 2013-04-18 DIAGNOSIS — N951 Menopausal and female climacteric states: Secondary | ICD-10-CM

## 2013-04-18 DIAGNOSIS — N949 Unspecified condition associated with female genital organs and menstrual cycle: Secondary | ICD-10-CM

## 2013-04-18 DIAGNOSIS — Z1151 Encounter for screening for human papillomavirus (HPV): Secondary | ICD-10-CM | POA: Insufficient documentation

## 2013-04-18 DIAGNOSIS — N938 Other specified abnormal uterine and vaginal bleeding: Secondary | ICD-10-CM

## 2013-04-18 LAB — FOLLICLE STIMULATING HORMONE: FSH: 38.6 m[IU]/mL

## 2013-04-18 NOTE — Patient Instructions (Addendum)
Endometrial Biopsy This is a test in which a tissue sample (a biopsy) is taken from inside the uterus (womb). It is then looked at by a specialist under a microscope to see if the tissue is normal or abnormal. The endometrium is the lining of the uterus. This test helps determine where you are in your menstrual cycle and how hormone levels are affecting the lining of the uterus. Another use for this test is to diagnose endometrial cancer, tuberculosis, polyps, or inflammatory conditions and to evaluate uterine bleeding. PREPARATION FOR TEST No preparation or fasting is necessary. NORMAL FINDINGS No pathologic conditions. Presence of "secretory-type" endometrium 3 to 5 days before to normal menstruation. Ranges for normal findings may vary among different laboratories and hospitals. You should always check with your doctor after having lab work or other tests done to discuss the meaning of your test results and whether your values are considered within normal limits. MEANING OF TEST  Your caregiver will go over the test results with you and discuss the importance and meaning of your results, as well as treatment options and the need for additional tests if necessary. OBTAINING THE TEST RESULTS It is your responsibility to obtain your test results. Ask the lab or department performing the test when and how you will get your results. Document Released: 03/31/2005 Document Revised: 02/20/2012 Document Reviewed: 11/07/2008 [employer] Patient Information 2013 [employer].  

## 2013-04-18 NOTE — Progress Notes (Signed)
Becky Brown August 01, 1966 [id number]   History:    47 y.o.  for annual gyn exam with a complaint of having had 2 cycles in the month of April. Patient has not been sexually active since September 2013. Patient is also complaining of some vasomotor symptoms such as hot flashes but very infrequent. Review of patient's records indicated that 15 years ago she had an abnormal Pap smear and had cryotherapy. Subsequent Pap smears have been normal. Patient's Tdap vaccine is up-to-date. Patient with past history of recurrent pregnancy losses.  Past medical history,surgical history, family history and social history were all reviewed and documented in the EPIC chart.  Gynecologic History Patient's last menstrual period was 04/02/2013. Contraception: none Last Pap: 2012. Results were: normal Last mammogram: 2014. Results were: normal  Obstetric History OB History   Grav Para Term Preterm Abortions TAB SAB Ect Mult Living   3 0   3  3   0     # Outc Date GA Lbr Len/2nd Wgt Sex Del Anes PTL Lv   1 SAB            2 SAB            3 SAB                ROS: A ROS was performed and pertinent positives and negatives are included in the history.  GENERAL: No fevers or chills. HEENT: No change in vision, no earache, sore throat or sinus congestion. NECK: No pain or stiffness. CARDIOVASCULAR: No chest pain or pressure. No palpitations. PULMONARY: No shortness of breath, cough or wheeze. GASTROINTESTINAL: No abdominal pain, nausea, vomiting or diarrhea, melena or bright red blood per rectum. GENITOURINARY: No urinary frequency, urgency, hesitancy or dysuria. MUSCULOSKELETAL: No joint or muscle pain, no back pain, no recent trauma. DERMATOLOGIC: No rash, no itching, no lesions. ENDOCRINE: No polyuria, polydipsia, no heat or cold intolerance. No recent change in weight. HEMATOLOGICAL: No anemia or easy bruising or bleeding. NEUROLOGIC: No headache, seizures, numbness, tingling or weakness. PSYCHIATRIC: No depression,  no loss of interest in normal activity or change in sleep pattern.     Exam: chaperone present  BP 130/86  Ht 5' 4.5" (1.638 m)  Wt 192 lb (87.091 kg)  BMI 32.46 kg/m2  LMP 04/02/2013  Body mass index is 32.46 kg/(m^2).  General appearance : Well developed well nourished female. No acute distress HEENT: Neck supple, trachea midline, no carotid bruits, no thyroidmegaly Lungs: Clear to auscultation, no rhonchi or wheezes, or rib retractions  Heart: Regular rate and rhythm, no murmurs or gallops Breast:Examined in sitting and supine position were symmetrical in appearance, no palpable masses or tenderness,  no skin retraction, no nipple inversion, no nipple discharge, no skin discoloration, no axillary or supraclavicular lymphadenopathy Abdomen: no palpable masses or tenderness, no rebound or guarding Extremities: no edema or skin discoloration or tenderness  Pelvic:  Bartholin, Urethra, Skene Glands: Within normal limits             Vagina: No gross lesions or discharge  Cervix: No gross lesions or discharge  Uterus  anteverted, normal size, shape and consistency, non-tender and mobile  Adnexa  Without masses or tenderness  Anus and perineum  normal   Rectovaginal  normal sphincter tone without palpated masses or tenderness             Hemoccult not indicated  Because of patient's age and to periods in the month of April we proceeded with doing an  endometrial biopsy after the patient was counseled. Her cervix was cleansed with Betadine solution. A single-tooth tenaculum was placed on the anterior cervical lip. A Pipelle was introduced into the intrauterine cavity after dilating her cervix. Tissue was obtained and submitted for histological evaluation. Pap smear was done prior to endometrial biopsy.  Assessment/Plan:  47 y.o. female for annual exam perimenopausal with dysfunctional uterine bleeding underwent endometrial biopsy today results pending at time of this dictation. Patient  will return to the office in one to 2 weeks for a sonohysterogram. Patient has had history in the past of endometrial polyps back in 2005 which were resected. Patient's primary physician Dr. Malachy Mood has been doing her lab work. We discussed importance of calcium vitamin D for osteoporosis prevention. Also reminded her of the importance of monthly self breast examination. We are going to also to check an [facility] today.    Ok Edwards MD, 10:24 AM 04/18/2013

## 2013-04-30 ENCOUNTER — Encounter: Payer: Self-pay | Admitting: Gynecology

## 2013-07-24 ENCOUNTER — Ambulatory Visit: Payer: 59 | Admitting: Gynecology

## 2013-07-24 ENCOUNTER — Other Ambulatory Visit: Payer: 59

## 2014-03-19 ENCOUNTER — Other Ambulatory Visit: Payer: Self-pay

## 2014-03-19 DIAGNOSIS — Z1231 Encounter for screening mammogram for malignant neoplasm of breast: Secondary | ICD-10-CM

## 2014-04-04 ENCOUNTER — Ambulatory Visit
Admission: RE | Admit: 2014-04-04 | Discharge: 2014-04-04 | Disposition: A | Discharge status: Home / Self Care | Payer: 59 | Source: Ambulatory Visit

## 2014-04-04 DIAGNOSIS — Z1231 Encounter for screening mammogram for malignant neoplasm of breast: Secondary | ICD-10-CM

## 2014-06-26 ENCOUNTER — Encounter: Payer: Self-pay | Admitting: Gynecology

## 2014-06-26 ENCOUNTER — Ambulatory Visit (INDEPENDENT_AMBULATORY_CARE_PROVIDER_SITE_OTHER): Payer: 59 | Admitting: Gynecology

## 2014-06-26 VITALS — BP 130/86 | Ht 65.0 in | Wt 179.0 lb

## 2014-06-26 DIAGNOSIS — N951 Menopausal and female climacteric states: Secondary | ICD-10-CM

## 2014-06-26 DIAGNOSIS — Z01419 Encounter for gynecological examination (general) (routine) without abnormal findings: Secondary | ICD-10-CM

## 2014-06-26 DIAGNOSIS — D251 Intramural leiomyoma of uterus: Secondary | ICD-10-CM

## 2014-06-26 NOTE — Progress Notes (Signed)
Becky Brown 1966/07/16 [id number]   History:    48 y.o.  for annual gyn exam with complaint of occasional hot flashes and her cycle sometimes delayed by a few days. Last year she had similar complaints and an [place] was done which was found to be in the menopausal range. Patient with past history of infertility and recurrent pregnancy losses. Patient using no contraception currently. Patient several years ago had history of dysplasia treated by cryo-and subsequent Pap smears have been normal. The patient in 2002 at abdominal myomectomy.  Past medical history,surgical history, family history and social history were all reviewed and documented in the EPIC chart.  Gynecologic History Patient's last menstrual period was 05/21/2014. Contraception: none Last Pap: 2014. Results were: normal Last mammogram: 2015. Results were: normal  Obstetric History OB History  Gravida Para Term Preterm AB SAB TAB Ectopic Multiple Living  3 0   3 3    0    # Outcome Date GA Lbr Len/2nd Weight Sex Delivery Anes PTL Lv  3 SAB           2 SAB           1 SAB                ROS: A ROS was performed and pertinent positives and negatives are included in the history.  GENERAL: No fevers or chills. HEENT: No change in vision, no earache, sore throat or sinus congestion. NECK: No pain or stiffness. CARDIOVASCULAR: No chest pain or pressure. No palpitations. PULMONARY: No shortness of breath, cough or wheeze. GASTROINTESTINAL: No abdominal pain, nausea, vomiting or diarrhea, melena or bright red blood per rectum. GENITOURINARY: No urinary frequency, urgency, hesitancy or dysuria. MUSCULOSKELETAL: No joint or muscle pain, no back pain, no recent trauma. DERMATOLOGIC: No rash, no itching, no lesions. ENDOCRINE: No polyuria, polydipsia, no heat or cold intolerance. No recent change in weight. HEMATOLOGICAL: No anemia or easy bruising or bleeding. NEUROLOGIC: No headache, seizures, numbness, tingling or weakness.  PSYCHIATRIC: No depression, no loss of interest in normal activity or change in sleep pattern.     Exam: chaperone present  BP 130/86  Ht 5\' 5"  (1.651 m)  Wt 179 lb (81.194 kg)  BMI 29.79 kg/m2  LMP 05/21/2014  Body mass index is 29.79 kg/(m^2).  General appearance : Well developed well nourished female. No acute distress HEENT: Neck supple, trachea midline, no carotid bruits, no thyroidmegaly Lungs: Clear to auscultation, no rhonchi or wheezes, or rib retractions  Heart: Regular rate and rhythm, no murmurs or gallops Breast:Examined in sitting and supine position were symmetrical in appearance, no palpable masses or tenderness,  no skin retraction, no nipple inversion, no nipple discharge, no skin discoloration, no axillary or supraclavicular lymphadenopathy Abdomen: no palpable masses or tenderness, no rebound or guarding Extremities: no edema or skin discoloration or tenderness  Pelvic:  Bartholin, Urethra, Skene Glands: Within normal limits             Vagina: No gross lesions or discharge  Cervix: No gross lesions or discharge  Uterus  anteverted, normal size, shape and consistency, non-tender and mobile  Adnexa  Without masses or tenderness  Anus and perineum  normal   Rectovaginal  normal sphincter tone without palpated masses or tenderness             Hemoccult not indicated     Assessment/Plan:  48 y.o. female for annual exam who appears to be perimenopausal period last year [facility] was  elevated we will repeat it again this year. I'm going to provide her with literature information on the menopause as well as hormone replacement therapy. She was reminded of the importance of calcium and vitamin D and regular exercise for osteoporosis prevention. Her primary doctor Dr. Haynes Hoehn has been doing her blood work.  Note: This dictation was prepared with  Dragon/digital dictation along withSmart phrase technology. Any transcriptional errors that result from this process are  unintentional.   Terrance Mass MD, 11:15 AM 06/26/2014

## 2014-06-26 NOTE — Patient Instructions (Signed)
Hormone Therapy At menopause, your body begins making less estrogen and progesterone hormones. This causes the body to stop having menstrual periods. This is because estrogen and progesterone hormones control your periods and menstrual cycle. A lack of estrogen may cause symptoms such as:  Hot flushes (or hot flashes).  Vaginal dryness.  Dry skin.  Loss of sex drive.  Risk of bone loss (osteoporosis). When this happens, you may choose to take hormone therapy to get back the estrogen lost during menopause. When the hormone estrogen is given alone, it is usually referred to as ET (Estrogen Therapy). When the hormone progestin is combined with estrogen, it is generally called HT (Hormone Therapy). This was formerly known as hormone replacement therapy (HRT). Your caregiver can help you make a decision on what will be best for you. The decision to use HT seems to change often as new studies are done. Many studies do not agree on the benefits of hormone replacement therapy. LIKELY BENEFITS OF HT INCLUDE PROTECTION FROM:  Hot Flushes (also called hot flashes) - A hot flush is a sudden feeling of heat that spreads over the face and body. The skin may redden like a blush. It is connected with sweats and sleep disturbance. Women going through menopause may have hot flushes a few times a month or several times per day depending on the woman.  Osteoporosis (bone loss)- Estrogen helps guard against bone loss. After menopause, a woman's bones slowly lose calcium and become weak and brittle. As a result, bones are more likely to break. The hip, wrist, and spine are affected most often. Hormone therapy can help slow bone loss after menopause. Weight bearing exercise and taking calcium with vitamin D also can help prevent bone loss. There are also medications that your caregiver can prescribe that can help prevent osteoporosis.  Vaginal Dryness - Loss of estrogen causes changes in the vagina. Its lining may  become thin and dry. These changes can cause pain and bleeding during sexual intercourse. Dryness can also lead to infections. This can cause burning and itching. (Vaginal estrogen treatment can help relieve pain, itching, and dryness.)  Urinary Tract Infections are more common after menopause because of lack of estrogen. Some women also develop urinary incontinence because of low estrogen levels in the vagina and bladder.  Possible other benefits of estrogen include a positive effect on mood and short-term memory in women. RISKS AND COMPLICATIONS  Using estrogen alone without progesterone causes the lining of the uterus to grow. This increases the risk of lining of the uterus (endometrial) cancer. Your caregiver should give another hormone called progestin if you have a uterus.  Women who take combined (estrogen and progestin) HT appear to have an increased risk of breast cancer. The risk appears to be small, but increases throughout the time that HT is taken.  Combined therapy also makes the breast tissue slightly denser which makes it harder to read mammograms (breast X-rays).  Combined, estrogen and progesterone therapy can be taken together every day, in which case there may be spotting of blood. HT therapy can be taken cyclically in which case you will have menstrual periods. Cyclically means HT is taken for a set amount of days, then not taken, then this process is repeated.  HT may increase the risk of stroke, heart attack, breast cancer and forming blood clots in your leg.  Transdermal estrogen (estrogen that is absorbed through the skin with a patch or a cream) may have more positive results with:    Cholesterol.  Blood pressure.  Blood clots. Having the following conditions may indicate you should not have HT:  Endometrial cancer.  Liver disease.  Breast cancer.  Heart disease.  History of blood clots.  Stroke. TREATMENT   If you choose to take HT and have a uterus,  usually estrogen and progestin are prescribed.  Your caregiver will help you decide the best way to take the medications.  Possible ways to take estrogen include:  Pills.  Patches.  Gels.  Sprays.  Vaginal estrogen cream, rings and tablets.  It is best to take the lowest dose possible that will help your symptoms and take them for the shortest period of time that you can.  Hormone therapy can help relieve some of the problems (symptoms) that affect women at menopause. Before making a decision about HT, talk to your caregiver about what is best for you. Be well informed and comfortable with your decisions. HOME CARE INSTRUCTIONS   Follow your caregivers advice when taking the medications.  A Pap test is done to screen for cervical cancer.  The first Pap test should be done at age 21.  Between ages 21 and 29, Pap tests are repeated every 2 years.  Beginning at age 30, you are advised to have a Pap test every 3 years as long as your past 3 Pap tests have been normal.  Some women have medical problems that increase the chance of getting cervical cancer. Talk to your caregiver about these problems. It is especially important to talk to your caregiver if a new problem develops soon after your last Pap test. In these cases, your caregiver may recommend more frequent screening and Pap tests.  The above recommendations are the same for women who have or have not gotten the vaccine for HPV (Human Papillomavirus).  If you had a hysterectomy for a problem that was not a cancer or a condition that could lead to cancer, then you no longer need Pap tests. However, even if you no longer need a Pap test, a regular exam is a good idea to make sure no other problems are starting.   If you are between ages 65 and 70, and you have had normal Pap tests going back 10 years, you no longer need Pap tests. However, even if you no longer need a Pap test, a regular exam is a good idea to make sure no  other problems are starting.   If you have had past treatment for cervical cancer or a condition that could lead to cancer, you need Pap tests and screening for cancer for at least 20 years after your treatment.  If Pap tests have been discontinued, risk factors (such as a new sexual partner) need to be re-assessed to determine if screening should be resumed.  Some women may need screenings more often if they are at high risk for cervical cancer.  Get mammograms done as per the advice of your caregiver. SEEK IMMEDIATE MEDICAL CARE IF:  You develop abnormal vaginal bleeding.  You have pain or swelling in your legs, shortness of breath, or chest pain.  You develop dizziness or headaches.  You have lumps or changes in your breasts or armpits.  You have slurred speech.  You develop weakness or numbness of your arms or legs.  You have pain, burning, or bleeding when urinating.  You develop abdominal pain. Document Released: 08/27/2003 Document Revised: 02/20/2012 Document Reviewed: 12/15/2010 ExitCare Patient Information 2015 [employer]. This information is not intended to   replace advice given to you by your health care provider. Make sure you discuss any questions you have with your health care provider. Perimenopause Perimenopause is the time when your body begins to move into the menopause (no menstrual period for 12 straight months). It is a natural process. Perimenopause can begin 2-8 years before the menopause and usually lasts for 1 year after the menopause. During this time, your ovaries may or may not produce an egg. The ovaries vary in their production of estrogen and progesterone hormones each month. This can cause irregular menstrual periods, difficulty getting pregnant, vaginal bleeding between periods, and uncomfortable symptoms. CAUSES  Irregular production of the ovarian hormones, estrogen and progesterone, and not ovulating every month.  Other causes  include:  Tumor of the pituitary gland in the brain.  Medical disease that affects the ovaries.  Radiation treatment.  Chemotherapy.  Unknown causes.  Heavy smoking and excessive alcohol intake can bring on perimenopause sooner. SIGNS AND SYMPTOMS   Hot flashes.  Night sweats.  Irregular menstrual periods.  Decreased sex drive.  Vaginal dryness.  Headaches.  Mood swings.  Depression.  Memory problems.  Irritability.  Tiredness.  Weight gain.  Trouble getting pregnant.  The beginning of losing bone cells (osteoporosis).  The beginning of hardening of the arteries (atherosclerosis). DIAGNOSIS  Your health care provider will make a diagnosis by analyzing your age, menstrual history, and symptoms. He or she will do a physical exam and note any changes in your body, especially your female organs. Female hormone tests may or may not be helpful depending on the amount of female hormones you produce and when you produce them. However, other hormone tests may be helpful to rule out other problems. TREATMENT  In some cases, no treatment is needed. The decision on whether treatment is necessary during the perimenopause should be made by you and your health care provider based on how the symptoms are affecting you and your lifestyle. Various treatments are available, such as:  Treating individual symptoms with a specific medicine for that symptom.  Herbal medicines that can help specific symptoms.  Counseling.  Group therapy. HOME CARE INSTRUCTIONS   Keep track of your menstrual periods (when they occur, how heavy they are, how long between periods, and how long they last) as well as your symptoms and when they started.  Only take over-the-counter or prescription medicines as directed by your health care provider.  Sleep and rest.  Exercise.  Eat a diet that contains calcium (good for your bones) and soy (acts like the estrogen hormone).  Do not smoke.  Avoid  alcoholic beverages.  Take vitamin supplements as recommended by your health care provider. Taking vitamin E may help in certain cases.  Take calcium and vitamin D supplements to help prevent bone loss.  Group therapy is sometimes helpful.  Acupuncture may help in some cases. SEEK MEDICAL CARE IF:   You have questions about any symptoms you are having.  You need a referral to a specialist (gynecologist, psychiatrist, or psychologist). SEEK IMMEDIATE MEDICAL CARE IF:   You have vaginal bleeding.  Your period lasts longer than 8 days.  Your periods are recurring sooner than 21 days.  You have bleeding after intercourse.  You have severe depression.  You have pain when you urinate.  You have severe headaches.  You have vision problems. Document Released: 01/05/2005 Document Revised: 09/18/2013 Document Reviewed: 06/27/2013 ExitCare Patient Information 2015 [employer]. This information is not intended to replace advice given to   to you by your health care provider. Make sure you discuss any questions you have with your health care provider.  

## 2014-06-27 LAB — FOLLICLE STIMULATING HORMONE: FSH: 68 m[IU]/mL

## 2014-10-13 ENCOUNTER — Encounter: Payer: Self-pay | Admitting: Gynecology

## 2015-05-04 ENCOUNTER — Other Ambulatory Visit: Payer: Self-pay

## 2015-05-04 DIAGNOSIS — Z1231 Encounter for screening mammogram for malignant neoplasm of breast: Secondary | ICD-10-CM

## 2015-05-07 ENCOUNTER — Ambulatory Visit: Payer: 59

## 2015-05-08 ENCOUNTER — Ambulatory Visit: Payer: 59

## 2015-05-18 ENCOUNTER — Ambulatory Visit
Admission: RE | Admit: 2015-05-18 | Discharge: 2015-05-18 | Disposition: A | Discharge status: Home / Self Care | Payer: 59 | Source: Ambulatory Visit

## 2015-05-18 DIAGNOSIS — Z1231 Encounter for screening mammogram for malignant neoplasm of breast: Secondary | ICD-10-CM

## 2015-07-24 ENCOUNTER — Ambulatory Visit (INDEPENDENT_AMBULATORY_CARE_PROVIDER_SITE_OTHER): Payer: 59 | Admitting: Gynecology

## 2015-07-24 ENCOUNTER — Encounter: Payer: Self-pay | Admitting: Gynecology

## 2015-07-24 VITALS — BP 124/82 | Ht 65.25 in | Wt 175.0 lb

## 2015-07-24 DIAGNOSIS — N951 Menopausal and female climacteric states: Secondary | ICD-10-CM | POA: Insufficient documentation

## 2015-07-24 DIAGNOSIS — Z01419 Encounter for gynecological examination (general) (routine) without abnormal findings: Secondary | ICD-10-CM

## 2015-07-24 NOTE — Progress Notes (Signed)
Becky Brown 06-02-1966 [id number]   History:    49 y.o.  for annual gyn exam with complaint of hot flashes and no menstrual cycle since February 2016. Patient states she has not been sexually active in over 2 years. In 2014 her [employer] was found to be in the menopausal range.Patient with past history of infertility and recurrent pregnancy losses. Patient using no contraception currently. Patient several years ago had history of dysplasia treated by cryo-and subsequent Pap smears have been normal. The patient in 2002 at abdominal myomectomy. Patient had a colonoscopy early this year reported to be normal. Her PCP has been doing her blood work.  Past medical history,surgical history, family history and social history were all reviewed and documented in the EPIC chart.  Gynecologic History Patient's last menstrual period was 01/23/2015. Contraception: post menopausal status Last Pap: 2014. Results were: normal Last mammogram: 2016. Results were: normal  Obstetric History OB History  Gravida Para Term Preterm AB SAB TAB Ectopic Multiple Living  3 0   3 3    0    # Outcome Date GA Lbr Len/2nd Weight Sex Delivery Anes PTL Lv  3 SAB           2 SAB           1 SAB                ROS: A ROS was performed and pertinent positives and negatives are included in the history.  GENERAL: No fevers or chills. HEENT: No change in vision, no earache, sore throat or sinus congestion. NECK: No pain or stiffness. CARDIOVASCULAR: No chest pain or pressure. No palpitations. PULMONARY: No shortness of breath, cough or wheeze. GASTROINTESTINAL: No abdominal pain, nausea, vomiting or diarrhea, melena or bright red blood per rectum. GENITOURINARY: No urinary frequency, urgency, hesitancy or dysuria. MUSCULOSKELETAL: No joint or muscle pain, no back pain, no recent trauma. DERMATOLOGIC: No rash, no itching, no lesions. ENDOCRINE: No polyuria, polydipsia, no heat or cold intolerance. No recent change in weight.  HEMATOLOGICAL: No anemia or easy bruising or bleeding. NEUROLOGIC: No headache, seizures, numbness, tingling or weakness. PSYCHIATRIC: No depression, no loss of interest in normal activity or change in sleep pattern.     Exam: chaperone present  BP 124/82 mmHg  Ht 5' 5.25" (1.657 m)  Wt 175 lb (79.379 kg)  BMI 28.91 kg/m2  LMP 01/23/2015  Body mass index is 28.91 kg/(m^2).  General appearance : Well developed well nourished female. No acute distress HEENT: Eyes: no retinal hemorrhage or exudates,  Neck supple, trachea midline, no carotid bruits, no thyroidmegaly Lungs: Clear to auscultation, no rhonchi or wheezes, or rib retractions  Heart: Regular rate and rhythm, no murmurs or gallops Breast:Examined in sitting and supine position were symmetrical in appearance, no palpable masses or tenderness,  no skin retraction, no nipple inversion, no nipple discharge, no skin discoloration, no axillary or supraclavicular lymphadenopathy Abdomen: no palpable masses or tenderness, no rebound or guarding Extremities: no edema or skin discoloration or tenderness  Pelvic:  Bartholin, Urethra, Skene Glands: Within normal limits             Vagina: No gross lesions or discharge  Cervix: No gross lesions or discharge  Uterus  anteverted, normal size, shape and consistency, non-tender and mobile  Adnexa  Without masses or tenderness  Anus and perineum  normal   Rectovaginal  normal sphincter tone without palpated masses or tenderness  Hemoccult colonoscopy normal 2016     Assessment/Plan:  49 y.o. female for annual exam menopausal with some vasomotor symptoms not interested in HRT. We discussed women's health initiative study. Literature information on the menopause as well as on hormone replacement therapy will be provided. Her PCP we'll be doing her blood work. Pap smear due next year. Neck she'll we will obtain a baseline bone density study. We discussed importance of calcium vitamin D  and regular exercise for osteoporosis prevention.   Terrance Mass MD, 10:42 AM 07/24/2015

## 2015-07-24 NOTE — Patient Instructions (Signed)
Estradiol vaginal tablets What is this medicine? ESTRADIOL (es tra DYE ole) vaginal tablet is used to help relieve symptoms of vaginal irritation and dryness that occurs in some women during menopause. This medicine may be used for other purposes; ask your health care provider or pharmacist if you have questions. COMMON BRAND NAME(S): Vagifem What should I tell my health care provider before I take this medicine? They need to know if you have any of these conditions: -abnormal vaginal bleeding -blood vessel disease or blood clots -breast, cervical, endometrial, ovarian, liver, or uterine cancer -dementia -diabetes -gallbladder disease -heart disease or recent heart attack -high blood pressure -high cholesterol -high level of calcium in the blood -hysterectomy -kidney disease -liver disease -migraine headaches -protein C deficiency -protein S deficiency -stroke -systemic lupus erythematosus (SLE) -tobacco smoker -an unusual or allergic reaction to estrogens, other hormones, medicines, foods, dyes, or preservatives -pregnant or trying to get pregnant -breast-feeding How should I use this medicine? This medicine is only for use in the vagina. Do not take by mouth. Wash your hands before and after use. Read package directions carefully. Unwrap the pre-filled applicator package. Lie on your back, part and bend your knees. Gently insert the applicator tip high in the vagina and push the plunger to release the tablet into the vagina. Gently remove the applicator. Throw away the applicator after use. Do not use your medicine more often than directed. Finish the full course prescribed by your doctor or health care professional even if you think your condition is better. Do not stop using except on the advice of your doctor or health care professional. Talk to your pediatrician regarding the use of this medicine in children. A patient package insert for the product will be given with each  prescription and refill. Read this sheet carefully each time. The sheet may change frequently. Overdosage: If you think you have taken too much of this medicine contact a poison control center or emergency room at once. NOTE: This medicine is only for you. Do not share this medicine with others. What if I miss a dose? If you miss a dose, take it as soon as you can. If it is almost time for your next dose, take only that dose. Do not take double or extra doses. What may interact with this medicine? Do not take this medicine with any of the following medications: -aromatase inhibitors like aminoglutethimide, anastrozole, exemestane, letrozole, testolactone This medicine may also interact with the following medications: -antibiotics used to treat tuberculosis like rifabutin, rifampin and rifapentene -raloxifene or tamoxifen -warfarin This list may not describe all possible interactions. Give your health care provider a list of all the medicines, herbs, non-prescription drugs, or dietary supplements you use. Also tell them if you smoke, drink alcohol, or use illegal drugs. Some items may interact with your medicine. What should I watch for while using this medicine? Visit your health care professional for regular checks on your progress. You will need a regular breast and pelvic exam. You should also discuss the need for regular mammograms with your health care professional, and follow his or her guidelines. This medicine can make your body retain fluid, making your fingers, hands, or ankles swell. Your blood pressure can go up. Contact your doctor or health care professional if you feel you are retaining fluid. If you have any reason to think you are pregnant; stop taking this medicine at once and contact your doctor or health care professional. Tobacco smoking increases the risk of getting  a blood clot or having a stroke, especially if you are more than 51 years old. You are strongly advised not to  smoke. If you wear contact lenses and notice visual changes, or if the lenses begin to feel uncomfortable, consult your eye care specialist. If you are going to have elective surgery, you may need to stop taking this medicine beforehand. Consult your health care professional for advice prior to scheduling the surgery. What side effects may I notice from receiving this medicine? Side effects that you should report to your doctor or health care professional as soon as possible: -allergic reactions like skin rash, itching or hives, swelling of the face, lips, or tongue -breast tissue changes or discharge -changes in vision -chest pain -confusion, trouble speaking or understanding -dark urine -general ill feeling or flu-like symptoms -light-colored stools -nausea, vomiting -pain, swelling, warmth in the leg -right upper belly pain -severe headaches -shortness of breath -sudden numbness or weakness of the face, arm or leg -trouble walking, dizziness, loss of balance or coordination -unusual vaginal bleeding -yellowing of the eyes or skin Side effects that usually do not require medical attention (report to your doctor or health care professional if they continue or are bothersome): -hair loss -increased hunger or thirst -increased urination -symptoms of vaginal infection like itching, irritation or unusual discharge -unusually weak or tired This list may not describe all possible side effects. Call your doctor for medical advice about side effects. You may report side effects to FDA at 1-800-FDA-1088. Where should I keep my medicine? Keep out of the reach of children. Store at room temperature between 15 and 30 degrees C (59 and 86 degrees F). Throw away any unused medicine after the expiration date. NOTE: This sheet is a summary. It may not cover all possible information. If you have questions about this medicine, talk to your doctor, pharmacist, or health care provider.  2015,  Elsevier/Gold Standard. (2011-03-02 09:08:58) Hormone Therapy At menopause, your body begins making less estrogen and progesterone hormones. This causes the body to stop having menstrual periods. This is because estrogen and progesterone hormones control your periods and menstrual cycle. A lack of estrogen may cause symptoms such as:  Hot flushes (or hot flashes).  Vaginal dryness.  Dry skin.  Loss of sex drive.  Risk of bone loss (osteoporosis). When this happens, you may choose to take hormone therapy to get back the estrogen lost during menopause. When the hormone estrogen is given alone, it is usually referred to as ET (Estrogen Therapy). When the hormone progestin is combined with estrogen, it is generally called HT (Hormone Therapy). This was formerly known as hormone replacement therapy (HRT). Your caregiver can help you make a decision on what will be best for you. The decision to use HT seems to change often as new studies are done. Many studies do not agree on the benefits of hormone replacement therapy. LIKELY BENEFITS OF HT INCLUDE PROTECTION FROM:  Hot Flushes (also called hot flashes) - A hot flush is a sudden feeling of heat that spreads over the face and body. The skin may redden like a blush. It is connected with sweats and sleep disturbance. Women going through menopause may have hot flushes a few times a month or several times per day depending on the woman.  Osteoporosis (bone loss)- Estrogen helps guard against bone loss. After menopause, a woman's bones slowly lose calcium and become weak and brittle. As a result, bones are more likely to break. The hip, wrist,  and spine are affected most often. Hormone therapy can help slow bone loss after menopause. Weight bearing exercise and taking calcium with vitamin D also can help prevent bone loss. There are also medications that your caregiver can prescribe that can help prevent osteoporosis.  Vaginal Dryness - Loss of estrogen  causes changes in the vagina. Its lining may become thin and dry. These changes can cause pain and bleeding during sexual intercourse. Dryness can also lead to infections. This can cause burning and itching. (Vaginal estrogen treatment can help relieve pain, itching, and dryness.)  Urinary Tract Infections are more common after menopause because of lack of estrogen. Some women also develop urinary incontinence because of low estrogen levels in the vagina and bladder.  Possible other benefits of estrogen include a positive effect on mood and short-term memory in women. RISKS AND COMPLICATIONS  Using estrogen alone without progesterone causes the lining of the uterus to grow. This increases the risk of lining of the uterus (endometrial) cancer. Your caregiver should give another hormone called progestin if you have a uterus.  Women who take combined (estrogen and progestin) HT appear to have an increased risk of breast cancer. The risk appears to be small, but increases throughout the time that HT is taken.  Combined therapy also makes the breast tissue slightly denser which makes it harder to read mammograms (breast X-rays).  Combined, estrogen and progesterone therapy can be taken together every day, in which case there may be spotting of blood. HT therapy can be taken cyclically in which case you will have menstrual periods. Cyclically means HT is taken for a set amount of days, then not taken, then this process is repeated.  HT may increase the risk of stroke, heart attack, breast cancer and forming blood clots in your leg.  Transdermal estrogen (estrogen that is absorbed through the skin with a patch or a cream) may have more positive results with:  Cholesterol.  Blood pressure.  Blood clots. Having the following conditions may indicate you should not have HT:  Endometrial cancer.  Liver disease.  Breast cancer.  Heart disease.  History of blood clots.  Stroke. TREATMENT    If you choose to take HT and have a uterus, usually estrogen and progestin are prescribed.  Your caregiver will help you decide the best way to take the medications.  Possible ways to take estrogen include:  Pills.  Patches.  Gels.  Sprays.  Vaginal estrogen cream, rings and tablets.  It is best to take the lowest dose possible that will help your symptoms and take them for the shortest period of time that you can.  Hormone therapy can help relieve some of the problems (symptoms) that affect women at menopause. Before making a decision about HT, talk to your caregiver about what is best for you. Be well informed and comfortable with your decisions. HOME CARE INSTRUCTIONS   Follow your caregivers advice when taking the medications.  A Pap test is done to screen for cervical cancer.  The first Pap test should be done at age 32.  Between ages 12 and 75, Pap tests are repeated every 2 years.  Beginning at age 56, you are advised to have a Pap test every 3 years as long as your past 3 Pap tests have been normal.  Some women have medical problems that increase the chance of getting cervical cancer. Talk to your caregiver about these problems. It is especially important to talk to your caregiver if a new  problem develops soon after your last Pap test. In these cases, your caregiver may recommend more frequent screening and Pap tests.  The above recommendations are the same for women who have or have not gotten the vaccine for HPV (Human Papillomavirus).  If you had a hysterectomy for a problem that was not a cancer or a condition that could lead to cancer, then you no longer need Pap tests. However, even if you no longer need a Pap test, a regular exam is a good idea to make sure no other problems are starting.   If you are between ages 91 and 70, and you have had normal Pap tests going back 10 years, you no longer need Pap tests. However, even if you no longer need a Pap test, a  regular exam is a good idea to make sure no other problems are starting.   If you have had past treatment for cervical cancer or a condition that could lead to cancer, you need Pap tests and screening for cancer for at least 20 years after your treatment.  If Pap tests have been discontinued, risk factors (such as a new sexual partner) need to be re-assessed to determine if screening should be resumed.  Some women may need screenings more often if they are at high risk for cervical cancer.  Get mammograms done as per the advice of your caregiver. SEEK IMMEDIATE MEDICAL CARE IF:  You develop abnormal vaginal bleeding.  You have pain or swelling in your legs, shortness of breath, or chest pain.  You develop dizziness or headaches.  You have lumps or changes in your breasts or armpits.  You have slurred speech.  You develop weakness or numbness of your arms or legs.  You have pain, burning, or bleeding when urinating.  You develop abdominal pain. Document Released: 08/27/2003 Document Revised: 02/20/2012 Document Reviewed: 12/15/2010 [facility] Patient Information 2015 [place], [place]. This information is not intended to replace advice given to you by your health care provider. Make sure you discuss any questions you have with your health care provider. Menopause Menopause is the normal time of life when menstrual periods stop completely. Menopause is complete when you have missed 12 consecutive menstrual periods. It usually occurs between the ages of 79 years and 64 years. Very rarely does a woman develop menopause before the age of 29 years. At menopause, your ovaries stop producing the female hormones estrogen and progesterone. This can cause undesirable symptoms and also affect your health. Sometimes the symptoms may occur 4-5 years before the menopause begins. There is no relationship between menopause and:  Oral contraceptives.  Number of children you had.  Race.  The age  your menstrual periods started (menarche). Heavy smokers and very thin women may develop menopause earlier in life. CAUSES  The ovaries stop producing the female hormones estrogen and progesterone.  Other causes include:  Surgery to remove both ovaries.  The ovaries stop functioning for no known reason.  Tumors of the pituitary gland in the brain.  Medical disease that affects the ovaries and hormone production.  Radiation treatment to the abdomen or pelvis.  Chemotherapy that affects the ovaries. SYMPTOMS   Hot flashes.  Night sweats.  Decrease in sex drive.  Vaginal dryness and thinning of the vagina causing painful intercourse.  Dryness of the skin and developing wrinkles.  Headaches.  Tiredness.  Irritability.  Memory problems.  Weight gain.  Bladder infections.  Hair growth of the face and chest.  Infertility. More serious symptoms  include:  Loss of bone (osteoporosis) causing breaks (fractures).  Depression.  Hardening and narrowing of the arteries (atherosclerosis) causing heart attacks and strokes. DIAGNOSIS   When the menstrual periods have stopped for 12 straight months.  Physical exam.  Hormone studies of the blood. TREATMENT  There are many treatment choices and nearly as many questions about them. The decisions to treat or not to treat menopausal changes is an individual choice made with your health care provider. Your health care provider can discuss the treatments with you. Together, you can decide which treatment will work best for you. Your treatment choices may include:   Hormone therapy (estrogen and progesterone).  Non-hormonal medicines.  Treating the individual symptoms with medicine (for example antidepressants for depression).  Herbal medicines that may help specific symptoms.  Counseling by a psychiatrist or psychologist.  Group therapy.  Lifestyle changes including:  Eating healthy.  Regular exercise.  Limiting  caffeine and alcohol.  Stress management and meditation.  No treatment. HOME CARE INSTRUCTIONS   Take the medicine your health care provider gives you as directed.  Get plenty of sleep and rest.  Exercise regularly.  Eat a diet that contains calcium (good for the bones) and soy products (acts like estrogen hormone).  Avoid alcoholic beverages.  Do not smoke.  If you have hot flashes, dress in layers.  Take supplements, calcium, and vitamin D to strengthen bones.  You can use over-the-counter lubricants or moisturizers for vaginal dryness.  Group therapy is sometimes very helpful.  Acupuncture may be helpful in some cases. SEEK MEDICAL CARE IF:   You are not sure you are in menopause.  You are having menopausal symptoms and need advice and treatment.  You are still having menstrual periods after age 37 years.  You have pain with intercourse.  Menopause is complete (no menstrual period for 12 months) and you develop vaginal bleeding.  You need a referral to a specialist (gynecologist, psychiatrist, or psychologist) for treatment. SEEK IMMEDIATE MEDICAL CARE IF:   You have severe depression.  You have excessive vaginal bleeding.  You fell and think you have a broken bone.  You have pain when you urinate.  You develop leg or chest pain.  You have a fast pounding heart beat (palpitations).  You have severe headaches.  You develop vision problems.  You feel a lump in your breast.  You have abdominal pain or severe indigestion. Document Released: 02/18/2004 Document Revised: 07/31/2013 Document Reviewed: 06/27/2013 [facility] Patient Information 2015 [place], [place]. This information is not intended to replace advice given to you by your health care provider. Make sure you discuss any questions you have with your health care provider.

## 2016-04-15 DIAGNOSIS — M654 Radial styloid tenosynovitis [de Quervain]: Secondary | ICD-10-CM | POA: Diagnosis not present

## 2016-04-15 MED FILL — DICLOFENAC SODIUM 1% GEL: 1 | 30 days supply | Qty: 500 | Fill #0

## 2016-05-31 DIAGNOSIS — H6122 Impacted cerumen, left ear: Secondary | ICD-10-CM | POA: Diagnosis not present

## 2016-05-31 DIAGNOSIS — H905 Unspecified sensorineural hearing loss: Secondary | ICD-10-CM | POA: Diagnosis not present

## 2016-05-31 DIAGNOSIS — H9311 Tinnitus, right ear: Secondary | ICD-10-CM | POA: Diagnosis not present

## 2016-05-31 DIAGNOSIS — H9041 Sensorineural hearing loss, unilateral, right ear, with unrestricted hearing on the contralateral side: Secondary | ICD-10-CM | POA: Diagnosis not present

## 2016-06-08 ENCOUNTER — Emergency Department (HOSPITAL_BASED_OUTPATIENT_CLINIC_OR_DEPARTMENT_OTHER): Payer: 59

## 2016-06-08 ENCOUNTER — Encounter (HOSPITAL_BASED_OUTPATIENT_CLINIC_OR_DEPARTMENT_OTHER): Payer: Self-pay | Admitting: *Deleted

## 2016-06-08 ENCOUNTER — Emergency Department (HOSPITAL_BASED_OUTPATIENT_CLINIC_OR_DEPARTMENT_OTHER)
Admission: EM | Admit: 2016-06-08 | Discharge: 2016-06-09 | Disposition: A | Discharge status: Home / Self Care | Payer: 59 | Attending: Emergency Medicine | Admitting: Emergency Medicine

## 2016-06-08 DIAGNOSIS — S60512A Abrasion of left hand, initial encounter: Secondary | ICD-10-CM | POA: Insufficient documentation

## 2016-06-08 DIAGNOSIS — S82155A Nondisplaced fracture of left tibial tuberosity, initial encounter for closed fracture: Secondary | ICD-10-CM | POA: Insufficient documentation

## 2016-06-08 DIAGNOSIS — Y92410 Unspecified street and highway as the place of occurrence of the external cause: Secondary | ICD-10-CM | POA: Insufficient documentation

## 2016-06-08 DIAGNOSIS — S60419A Abrasion of unspecified finger, initial encounter: Secondary | ICD-10-CM

## 2016-06-08 DIAGNOSIS — W1839XA Other fall on same level, initial encounter: Secondary | ICD-10-CM | POA: Insufficient documentation

## 2016-06-08 DIAGNOSIS — S60414A Abrasion of right ring finger, initial encounter: Secondary | ICD-10-CM | POA: Diagnosis not present

## 2016-06-08 DIAGNOSIS — M25462 Effusion, left knee: Secondary | ICD-10-CM | POA: Diagnosis not present

## 2016-06-08 DIAGNOSIS — S60411A Abrasion of left index finger, initial encounter: Secondary | ICD-10-CM | POA: Diagnosis not present

## 2016-06-08 DIAGNOSIS — Y999 Unspecified external cause status: Secondary | ICD-10-CM | POA: Insufficient documentation

## 2016-06-08 DIAGNOSIS — S60412A Abrasion of right middle finger, initial encounter: Secondary | ICD-10-CM | POA: Diagnosis not present

## 2016-06-08 DIAGNOSIS — S6992XA Unspecified injury of left wrist, hand and finger(s), initial encounter: Secondary | ICD-10-CM | POA: Diagnosis not present

## 2016-06-08 DIAGNOSIS — M79642 Pain in left hand: Secondary | ICD-10-CM | POA: Diagnosis not present

## 2016-06-08 DIAGNOSIS — S82151A Displaced fracture of right tibial tuberosity, initial encounter for closed fracture: Secondary | ICD-10-CM | POA: Diagnosis not present

## 2016-06-08 DIAGNOSIS — Y9389 Activity, other specified: Secondary | ICD-10-CM | POA: Diagnosis not present

## 2016-06-08 MED ORDER — TETANUS-DIPHTH-ACELL PERTUSSIS 5-2.5-18.5 LF-MCG/0.5 IM SUSP
0.5000 mL | Freq: Once | INTRAMUSCULAR | Status: AC
  Administered 2016-06-08: 0.5 mL via INTRAMUSCULAR
  Filled 2016-06-08: qty 0.5

## 2016-06-08 MED ORDER — NAPROXEN 250 MG PO TABS
500.0000 mg | ORAL_TABLET | Freq: Once | ORAL | Status: AC
  Administered 2016-06-08: 500 mg via ORAL
  Filled 2016-06-08: qty 2

## 2016-06-08 NOTE — ED Notes (Signed)
Pt c/o fall with left hand and left knee injury x 30 mins ago

## 2016-06-08 NOTE — ED Provider Notes (Signed)
CSN: [id number]     Arrival date & time 06/08/16  2128 History  By signing my name below, I, Soijett Blue, attest that this documentation has been prepared under the direction and in the presence of Merryl Hacker, MD. Electronically Signed: Soijett Blue, ED Scribe. 06/08/2016. 11:39 PM.   Chief Complaint  Patient presents with  . Fall      The history is provided by the patient. No language interpreter was used.    HPI Comments: Becky Brown is a 50 y.o. female who presents to the Emergency Department complaining of a fall onset 2.5 hours ago PTA. Pt notes that she was pushing her trashcans to the street with her left hand when she fell and braced herself with her outstretched hand. Pt reports that she is left hand dominant. Pt notes that she is not UTD on her tetanus vaccination. Pt is having associated symptoms of 5/10 left knee pain, 5/10 left hand pain, and left hand abrasions. She notes that she has not tried any medications for the  relief of her symptoms. She denies hitting her head, LOC, HA, gait problem, and any other symptoms.   Past Medical History  Diagnosis Date  . Fibroid   . Hearing loss     right ear   Past Surgical History  Procedure Laterality Date  . Tonsillectomy  06/18/2001  . Dilation and curettage of uterus      x2  . Hysteroscopy    . Pelvic laparoscopy    . Exploratory laparotomy  06/18/2001    with multiple myomectomy   Family History  Problem Relation Age of Onset  . Cancer Father     lung  . Breast cancer Maternal Grandmother   . Hypertension Maternal Grandmother   . Diabetes Maternal Grandmother   . Cancer Maternal Aunt 28    uterine cancer-  rare type of ca   Social History  Substance Use Topics  . Smoking status: Never Smoker   . Smokeless tobacco: Never Used  . Alcohol Use: 0.0 oz/week    0 Standard drinks or equivalent per week     Comment: 3 glasses of wine a year...   OB History    Gravida Para Term Preterm AB TAB SAB Ectopic  Multiple Living   3 0   3  3   0     Review of Systems  Musculoskeletal: Positive for arthralgias (left hand and left knee). Negative for gait problem.  Skin: Positive for wound (abrasions to left hand).  Neurological: Negative for syncope.  All other systems reviewed and are negative.   Allergies  Erythromycin  Home Medications   Prior to Admission medications   Medication Sig Start Date End Date Taking? Authorizing Provider  cholecalciferol (VITAMIN D) 1000 UNITS tablet Take 1,000 Units by mouth daily.    Historical Provider, MD  HYDROcodone-acetaminophen (NORCO/VICODIN) 5-325 MG tablet Take 1 tablet by mouth every 6 (six) hours as needed. 06/09/16   Merryl Hacker, MD  Multiple Vitamin (MULTIVITAMIN) tablet Take 1 tablet by mouth daily.    Historical Provider, MD  naproxen (NAPROSYN) 500 MG tablet Take 1 tablet (500 mg total) by mouth 2 (two) times daily with a meal. 06/09/16   Merryl Hacker, MD   BP 144/94 mmHg  Pulse 68  Temp(Src) 98.1 F (36.7 C) (Oral)  Resp 16  Ht 5\' 6"  (1.676 m)  Wt 175 lb (79.379 kg)  BMI 28.26 kg/m2  SpO2 98%  LMP 01/23/2015 Physical Exam  Constitutional: She is oriented to person, place, and time. She appears well-developed and well-nourished. No distress.  HENT:  Head: Normocephalic and atraumatic.  Cardiovascular: Normal rate and regular rhythm.   Pulmonary/Chest: Effort normal. No respiratory distress.  Musculoskeletal:  Normal range of motion of the left lower extremity, tenderness to palpation over the proximal tibia, no obvious deformity, mild bruising noted, no significant effusion, joint appears intact Examination of the left hand with abrasions over the dorsal aspect of the second third and fourth digits extending over the PIP joints, flexion and extension is intact, there is no snuffbox tenderness, 2+ radial pulse  Neurological: She is alert and oriented to person, place, and time.  Skin: Skin is warm and dry.  Psychiatric: She  has a normal mood and affect.  Nursing note and vitals reviewed.   ED Course  Procedures (including critical care time) DIAGNOSTIC STUDIES: Oxygen Saturation is 100% on RA, nl by my interpretation.    COORDINATION OF CARE: 11:22 PM Discussed treatment plan with pt at bedside which includes left knee xray, left hand xray, wound care, update tetanus, and pt agreed to plan.    Imaging Review Dg Knee Complete 4 Views Left  06/08/2016  CLINICAL DATA:  Pain after fall EXAM: LEFT KNEE - COMPLETE 4+ VIEW COMPARISON:  None. FINDINGS: Increased attenuation is seen in Hoffa's fat, inferior to the patella. This may be posttraumatic. There is no significant anterior soft tissue swelling. There is irregularity at the tibial tuberosity with a lucency at its base. However, there is no focal overlying soft tissue swelling. No other evidence of fracture. Suggested small suprapatellar effusion. IMPRESSION: Lucency through the base of the tibial tuberosity with no focal overlying soft tissue swelling. I suspect this may be developmental rather than posttraumatic. Recommend clinical correlation to exclude point tenderness in this location. Small joint effusion. Electronically Signed   By: Dorise Bullion III M.D   On: 06/08/2016 22:17   Dg Hand Complete Left  06/08/2016  CLINICAL DATA:  Pain after trauma. EXAM: LEFT HAND - COMPLETE 3+ VIEW COMPARISON:  None. FINDINGS: There is no evidence of fracture or dislocation. There is no evidence of arthropathy or other focal bone abnormality. Soft tissues are unremarkable. IMPRESSION: Negative. Electronically Signed   By: Dorise Bullion III M.D   On: 06/08/2016 22:14   I have personally reviewed and evaluated these images as part of my medical decision-making.    MDM   Final diagnoses:  Abrasion of finger of left hand, initial encounter  Closed nondisplaced fracture of left tibial tuberosity, initial encounter    Patient presents following a fall. Nontoxic on  exam. She has evidence of abrasions over the left hand but intact neurovascular exam. X-rays are negative for fracture in the hand. There is questionable lucency at the base of the tibial tuberosity. She is tender here. While the x-ray is inconclusive, will treat her with a knee immobilizer and crutches and have her follow-up with sports medicine in one week for repeat x-rays. Patient was given instructions regarding wound care of her hand. Will treat with bacitracin and dressings.  After history, exam, and medical workup I feel the patient has been appropriately medically screened and is safe for discharge home. Pertinent diagnoses were discussed with the patient. Patient was given return precautions.  I personally performed the services described in this documentation, which was scribed in my presence. The recorded information has been reviewed and is accurate.    Merryl Hacker, MD 06/09/16 0120

## 2016-06-09 MED ORDER — HYDROCODONE-ACETAMINOPHEN 5-325 MG PO TABS: 1.0000 | ORAL_TABLET | Freq: Four times a day (QID) | ORAL | Status: DC | PRN

## 2016-06-09 MED ORDER — NAPROXEN 500 MG PO TABS: 500.0000 mg | ORAL_TABLET | Freq: Two times a day (BID) | ORAL | Status: DC

## 2016-06-09 NOTE — Discharge Instructions (Signed)
You were seen today for abrasions of the left hand and pain of the left knee. Keep the abrasions covered with antibiotic ointment and gauze. There is a slight irregularity on your x-ray which may be a small tibial tuberosity fracture. You will be placed in a knee immobilizer and given crutches. Follow-up with sports medicine in one week for repeat x-rays and exam.  Abrasion An abrasion is a cut or scrape on the outer surface of your skin. An abrasion does not extend through all of the layers of your skin. It is important to care for your abrasion properly to prevent infection. CAUSES Most abrasions are caused by falling on or gliding across the ground or another surface. When your skin rubs on something, the outer and inner layer of skin rubs off.  SYMPTOMS A cut or scrape is the main symptom of this condition. The scrape may be bleeding, or it may appear red or pink. If there was an associated fall, there may be an underlying bruise. DIAGNOSIS An abrasion is diagnosed with a physical exam. TREATMENT Treatment for this condition depends on how large and deep the abrasion is. Usually, your abrasion will be cleaned with water and mild soap. This removes any dirt or debris that may be stuck. An antibiotic ointment may be applied to the abrasion to help prevent infection. A bandage (dressing) may be placed on the abrasion to keep it clean. You may also need a tetanus shot. HOME CARE INSTRUCTIONS Medicines  Take or apply medicines only as directed by your health care provider.  If you were prescribed an antibiotic ointment, finish all of it even if you start to feel better. Wound Care  Clean the wound with mild soap and water 2-3 times per day or as directed by your health care provider. Pat your wound dry with a clean towel. Do not rub it.  There are many different ways to close and cover a wound. Follow instructions from your health care provider about:  Wound care.  Dressing changes and  removal.  Check your wound every day for signs of infection. Watch for:  Redness, swelling, or pain.  Fluid, blood, or pus. General Instructions  Keep the dressing dry as directed by your health care provider. Do not take baths, swim, use a hot tub, or do anything that would put your wound underwater until your health care provider approves.  If there is swelling, raise (elevate) the injured area above the level of your heart while you are sitting or lying down.  Keep all follow-up visits as directed by your health care provider. This is important. SEEK MEDICAL CARE IF:  You received a tetanus shot and you have swelling, severe pain, redness, or bleeding at the injection site.  Your pain is not controlled with medicine.  You have increased redness, swelling, or pain at the site of your wound. SEEK IMMEDIATE MEDICAL CARE IF:  You have a red streak going away from your wound.  You have a fever.  You have fluid, blood, or pus coming from your wound.  You notice a bad smell coming from your wound or your dressing.   This information is not intended to replace advice given to you by your health care provider. Make sure you discuss any questions you have with your health care provider.   Document Released: 09/07/2005 Document Revised: 08/19/2015 Document Reviewed: 11/26/2014 Elsevier Interactive Patient Education 2016 [employer] Knee Immobilizer A knee immobilizer is used to support and protect an injured  or painful knee. Knee immobilizers keep your knee from being used while it is healing. Some of the common immobilizers used include splints (air, plaster, fiberglass, stiff cloth, or aluminum) or casts. Wear your knee immobilizer as instructed and only remove it as instructed. HOME CARE INSTRUCTIONS   Use absorbent powder (such as baby powder or talcum powder) to control irritation from sweat and friction.  Adjust the immobilizer to be firm but not tight. Signs of an  immobilizer that is too tight include:  Swelling.  Numbness.  Color change in your foot or ankle.  Increased pain.  While resting, raise your leg above the level of your heart. Pillows can be used for support. This reduces throbbing and helps healing.  Remove the immobilizer to bathe and sleep. SEEK MEDICAL CARE IF:   You have increasing pain or swelling in the knee, foot, or ankle.  You have problems caused by the knee immobilizer, or it breaks or needs replacement. MAKE SURE YOU:   Understand these instructions.  Will watch your condition.  Will get help right away if you are not doing well or get worse.   This information is not intended to replace advice given to you by your health care provider. Make sure you discuss any questions you have with your health care provider.   Document Released: 11/28/2005 Document Revised: 12/19/2014 Document Reviewed: 07/22/2013 Elsevier Interactive Patient Education [employer].

## 2016-06-09 NOTE — ED Notes (Signed)
Abrasions to left hand cleansed.  ABX ointment applied and dressings applied.

## 2016-06-10 ENCOUNTER — Encounter: Payer: Self-pay | Admitting: Family Medicine

## 2016-06-10 ENCOUNTER — Ambulatory Visit (INDEPENDENT_AMBULATORY_CARE_PROVIDER_SITE_OTHER): Payer: 59 | Admitting: Family Medicine

## 2016-06-10 VITALS — BP 126/83 | HR 70 | Ht 66.0 in | Wt 175.0 lb

## 2016-06-10 DIAGNOSIS — S8992XA Unspecified injury of left lower leg, initial encounter: Secondary | ICD-10-CM

## 2016-06-10 DIAGNOSIS — S6992XA Unspecified injury of left wrist, hand and finger(s), initial encounter: Secondary | ICD-10-CM | POA: Insufficient documentation

## 2016-06-10 NOTE — Assessment & Plan Note (Signed)
2/2 contusion and traumatic patellar tendinitis.  Discontinue immobilizer.  Icing, nsaids.  Sleeve or ace wrap.  Shown home exercises to do daily.

## 2016-06-10 NOTE — Progress Notes (Signed)
PCP: No primary care provider on file.  Subjective:   HPI: Patient is a 50 y.o. female here for left knee, hand injuries.  Patient reports on 6/28 she was pushing a trash can uphill when it turned over causing patient to fall - landed directly on left knee and badly scraped middle 3 fingers on left hand dorsally. Radiographs were negative. Has been using neosporin with gauze, changing these daily on digits. Wearing immobilizer left knee. No prior injuries. Pain is anterior, 4/10 and sharp. Worse with ambulation, pressing on area. No other skin changes, fever, numbness.  Past Medical History  Diagnosis Date  . Fibroid   . Hearing loss     right ear    Current Outpatient Prescriptions on File Prior to Visit  Medication Sig Dispense Refill  . HYDROcodone-acetaminophen (NORCO/VICODIN) 5-325 MG tablet Take 1 tablet by mouth every 6 (six) hours as needed. 5 tablet 0  . naproxen (NAPROSYN) 500 MG tablet Take 1 tablet (500 mg total) by mouth 2 (two) times daily with a meal. 30 tablet 0   No current facility-administered medications on file prior to visit.    Past Surgical History  Procedure Laterality Date  . Tonsillectomy  06/18/2001  . Dilation and curettage of uterus      x2  . Hysteroscopy    . Pelvic laparoscopy    . Exploratory laparotomy  06/18/2001    with multiple myomectomy    Allergies  Allergen Reactions  . Erythromycin     Social History   Social History  . Marital Status: Married    Spouse Name: N/A  . Number of Children: N/A  . Years of Education: N/A   Occupational History  . Not on file.   Social History Main Topics  . Smoking status: Never Smoker   . Smokeless tobacco: Never Used  . Alcohol Use: 0.0 oz/week    0 Standard drinks or equivalent per week     Comment: 3 glasses of wine a year...  . Drug Use: No  . Sexual Activity: No   Other Topics Concern  . Not on file   Social History Narrative    Family History  Problem Relation Age of  Onset  . Cancer Father     lung  . Breast cancer Maternal Grandmother   . Hypertension Maternal Grandmother   . Diabetes Maternal Grandmother   . Cancer Maternal Aunt 56    uterine cancer-  rare type of ca    BP 126/83 mmHg  Pulse 70  Ht 5\' 6"  (1.676 m)  Wt 175 lb (79.379 kg)  BMI 28.26 kg/m2  LMP 01/23/2015  Review of Systems: See HPI above.    Objective:  Physical Exam:  Gen: NAD, comfortable in exam room  Left knee: No gross deformity, ecchymoses, effusion. TTP tibial tubercle only.  No joint line or other tenderness. FROM. Negative ant/post drawers. Negative valgus/varus testing. Negative lachmanns. Negative mcmurrays, apleys, patellar apprehension. NV intact distally.  Left 3rd digit: Able to flex and extend at MCP, PIP, DIP joints. No malrotation or angulation Large abrasion dorsal aspect but no tendon visible.  Healing well - no bleeding.  MSK u/s Left knee: Patellar tendon intact with increased neovascularity at insertion on tibial tubercle.  No fracture, no retraction.    Assessment & Plan:  1. Left knee injury - 2/2 contusion and traumatic patellar tendinitis.  Discontinue immobilizer.  Icing, nsaids.  Sleeve or ace wrap.  Shown home exercises to do daily.  2. Left  finger abrasions - no evidence infection, tendon injury.  Discussed what to watch out for.  F/u in 1 1/2 weeks - continue dressing changes.

## 2016-06-10 NOTE — Patient Instructions (Signed)
Continue using neosporin, gauze (nonstick then regular) and coband, dressing changes at least daily. Follow up with me in 1 1/2 weeks to recheck your fingers. If you notice red streaking, fevers, or any other concerns call me.  You have a knee contusion and traumatic patellar tendinitis. Icing 15 minutes at a time 3-4 times a day. Ibuprofen 600mg  three times a day with food OR aleve 2 tabs twice a day with food for pain and inflammation. Sleeve, ACE wrap as needed for swelling, compression. You do not need a knee brace or the immobilizer for this. Do straight leg raises, knee extensions 3 sets of 10 once a day. Add ankle weight if these become too easy.

## 2016-06-10 NOTE — Assessment & Plan Note (Signed)
Left finger abrasions - no evidence infection, tendon injury.  Discussed what to watch out for.  F/u in 1 1/2 weeks - continue dressing changes.

## 2016-06-20 ENCOUNTER — Ambulatory Visit (INDEPENDENT_AMBULATORY_CARE_PROVIDER_SITE_OTHER): Payer: 59 | Admitting: Family Medicine

## 2016-06-20 VITALS — BP 122/75 | HR 71 | Ht 66.0 in

## 2016-06-20 DIAGNOSIS — S8992XD Unspecified injury of left lower leg, subsequent encounter: Secondary | ICD-10-CM | POA: Diagnosis not present

## 2016-06-20 DIAGNOSIS — S6992XD Unspecified injury of left wrist, hand and finger(s), subsequent encounter: Secondary | ICD-10-CM | POA: Diagnosis not present

## 2016-06-20 NOTE — Patient Instructions (Signed)
Continue using neosporin, gauze (nonstick then regular) and coband, dressing changes daily until the dressings are dry (probably another 1-2 weeks). If you notice red streaking, fevers, or any other concerns call me (unlikely at this point).  You have a knee contusion and traumatic patellar tendinitis. Icing 15 minutes at a time 3-4 times a day. Ibuprofen 600mg  three times a day with food OR aleve 2 tabs twice a day with food for pain and inflammation. Sleeve, ACE wrap as needed for swelling, compression. Consider patellar tendon strap Do straight leg raises, knee extensions 3 sets of 10 once a day. When able do the decline squats I showed you. Consider physical therapy if not improving. Follow up with me in 1 month.

## 2016-06-21 ENCOUNTER — Encounter: Payer: Self-pay | Admitting: Family Medicine

## 2016-06-22 NOTE — Assessment & Plan Note (Signed)
Left finger abrasions - no evidence infection, tendon injury.  Continue wrapping as long as these drain a little fluid until they crust over.  F/u in 1 month for both issues.

## 2016-06-22 NOTE — Assessment & Plan Note (Signed)
2/2 contusion and traumatic patellar tendinitis.  Icing, nsaids, compression.  Consider patellar strap.  Continue home exercises.  Consider PT, nitro patches if not improving.

## 2016-06-22 NOTE — Progress Notes (Signed)
PCP: No primary care provider on file.  Subjective:   HPI: Patient is a 50 y.o. female here for left knee, hand injuries.  6/30: Patient reports on 6/28 she was pushing a trash can uphill when it turned over causing patient to fall - landed directly on left knee and badly scraped middle 3 fingers on left hand dorsally. Radiographs were negative. Has been using neosporin with gauze, changing these daily on digits. Wearing immobilizer left knee. No prior injuries. Pain is anterior, 4/10 and sharp. Worse with ambulation, pressing on area. No other skin changes, fever, numbness.  7/10: Patient reports she is doing well. Still with some anterior knee pain 4/10 level. Has been doing home exercises, taking naproxen as needed. Fingers healing well also without redness, streaking. No fevers, skin changes otherwise, numbness.  Past Medical History  Diagnosis Date  . Fibroid   . Hearing loss     right ear    Current Outpatient Prescriptions on File Prior to Visit  Medication Sig Dispense Refill  . HYDROcodone-acetaminophen (NORCO/VICODIN) 5-325 MG tablet Take 1 tablet by mouth every 6 (six) hours as needed. 5 tablet 0  . Multiple Vitamin (MULTIVITAMIN) capsule Take by mouth.    . naproxen (NAPROSYN) 500 MG tablet Take 1 tablet (500 mg total) by mouth 2 (two) times daily with a meal. 30 tablet 0  . triamterene-hydrochlorothiazide (DYAZIDE) 37.5-25 MG capsule Take 1 capsule by mouth daily.  3   No current facility-administered medications on file prior to visit.    Past Surgical History  Procedure Laterality Date  . Tonsillectomy  06/18/2001  . Dilation and curettage of uterus      x2  . Hysteroscopy    . Pelvic laparoscopy    . Exploratory laparotomy  06/18/2001    with multiple myomectomy    Allergies  Allergen Reactions  . Erythromycin     Social History   Social History  . Marital Status: Married    Spouse Name: N/A  . Number of Children: N/A  . Years of Education:  N/A   Occupational History  . Not on file.   Social History Main Topics  . Smoking status: Never Smoker   . Smokeless tobacco: Never Used  . Alcohol Use: 0.0 oz/week    0 Standard drinks or equivalent per week     Comment: 3 glasses of wine a year...  . Drug Use: No  . Sexual Activity: No   Other Topics Concern  . Not on file   Social History Narrative    Family History  Problem Relation Age of Onset  . Cancer Father     lung  . Breast cancer Maternal Grandmother   . Hypertension Maternal Grandmother   . Diabetes Maternal Grandmother   . Cancer Maternal Aunt 56    uterine cancer-  rare type of ca    BP 122/75 mmHg  Pulse 71  Ht 5\' 6"  (1.676 m)  LMP 01/23/2015  Review of Systems: See HPI above.    Objective:  Physical Exam:  Gen: NAD, comfortable in exam room  Left knee: No gross deformity, ecchymoses, effusion. TTP tibial tubercle only.  No joint line or other tenderness. FROM. Negative ant/post drawers. Negative valgus/varus testing. Negative lachmanns. Negative mcmurrays, apleys, patellar apprehension. NV intact distally.  Left 3rd digit: Able to flex and extend at MCP, PIP, DIP joints. No malrotation or angulation Healing abrasions dorsal aspect of this and 4th digit.  No redness, drainage.    Assessment & Plan:  1. Left knee injury - 2/2 contusion and traumatic patellar tendinitis.  Icing, nsaids, compression.  Consider patellar strap.  Continue home exercises.  Consider PT, nitro patches if not improving.  2. Left finger abrasions - no evidence infection, tendon injury.  Continue wrapping as long as these drain a little fluid until they crust over.  F/u in 1 month for both issues.

## 2016-07-20 ENCOUNTER — Other Ambulatory Visit: Payer: Self-pay | Admitting: Gynecology

## 2016-07-20 DIAGNOSIS — Z1231 Encounter for screening mammogram for malignant neoplasm of breast: Secondary | ICD-10-CM

## 2016-07-21 ENCOUNTER — Ambulatory Visit
Admission: RE | Admit: 2016-07-21 | Discharge: 2016-07-21 | Disposition: A | Discharge status: Home / Self Care | Payer: 59 | Source: Ambulatory Visit | Attending: Gynecology | Admitting: Gynecology

## 2016-07-21 ENCOUNTER — Ambulatory Visit (INDEPENDENT_AMBULATORY_CARE_PROVIDER_SITE_OTHER): Payer: 59 | Admitting: Family Medicine

## 2016-07-21 ENCOUNTER — Encounter: Payer: Self-pay | Admitting: Family Medicine

## 2016-07-21 DIAGNOSIS — Z1231 Encounter for screening mammogram for malignant neoplasm of breast: Secondary | ICD-10-CM

## 2016-07-21 DIAGNOSIS — S61219D Laceration without foreign body of unspecified finger without damage to nail, subsequent encounter: Secondary | ICD-10-CM | POA: Diagnosis not present

## 2016-07-21 DIAGNOSIS — S8992XD Unspecified injury of left lower leg, subsequent encounter: Secondary | ICD-10-CM

## 2016-07-21 DIAGNOSIS — M654 Radial styloid tenosynovitis [de Quervain]: Secondary | ICD-10-CM

## 2016-07-21 DIAGNOSIS — S61219A Laceration without foreign body of unspecified finger without damage to nail, initial encounter: Secondary | ICD-10-CM | POA: Insufficient documentation

## 2016-07-21 DIAGNOSIS — S6992XD Unspecified injury of left wrist, hand and finger(s), subsequent encounter: Secondary | ICD-10-CM

## 2016-07-21 NOTE — Patient Instructions (Signed)
Use the wrist brace as often as possible. Try heat instead of ice also 15 minutes at a time 3-4 times a day. If over the next 1 month to 6 weeks you're still not improving, follow up with me and we will do a cortisone injection.

## 2016-07-22 NOTE — Progress Notes (Signed)
PCP: No primary care provider on file.  Subjective:   HPI: Patient is a 50 y.o. female here for left knee, hand injuries.  6/30: Patient reports on 6/28 she was pushing a trash can uphill when it turned over causing patient to fall - landed directly on left knee and badly scraped middle 3 fingers on left hand dorsally. Radiographs were negative. Has been using neosporin with gauze, changing these daily on digits. Wearing immobilizer left knee. No prior injuries. Pain is anterior, 4/10 and sharp. Worse with ambulation, pressing on area. No other skin changes, fever, numbness.  7/10: Patient reports she is doing well. Still with some anterior knee pain 4/10 level. Has been doing home exercises, taking naproxen as needed. Fingers healing well also without redness, streaking. No fevers, skin changes otherwise, numbness.  8/10: Patient returns with continued improvement since last visit. Some pain in right wrist on radial aspect, worse with movement of thumb. Lacerations, abrasions healing well. Pain up to 4/10 with thumb motions, sharp. Some soreness only with pushing on area of pain of knee. No other skin changes, drainage. No longer wrapping fingers.  Past Medical History:  Diagnosis Date  . Fibroid   . Hearing loss    right ear    Current Outpatient Prescriptions on File Prior to Visit  Medication Sig Dispense Refill  . Multiple Vitamin (MULTIVITAMIN) capsule Take by mouth.    . triamterene-hydrochlorothiazide (DYAZIDE) 37.5-25 MG capsule Take 1 capsule by mouth daily.  3   No current facility-administered medications on file prior to visit.     Past Surgical History:  Procedure Laterality Date  . DILATION AND CURETTAGE OF UTERUS     x2  . EXPLORATORY LAPAROTOMY  06/18/2001   with multiple myomectomy  . HYSTEROSCOPY    . PELVIC LAPAROSCOPY    . TONSILLECTOMY  06/18/2001    Allergies  Allergen Reactions  . Erythromycin     Social History   Social History   . Marital status: Married    Spouse name: N/A  . Number of children: N/A  . Years of education: N/A   Occupational History  . Not on file.   Social History Main Topics  . Smoking status: Never Smoker  . Smokeless tobacco: Never Used  . Alcohol use 0.0 oz/week     Comment: 3 glasses of wine a year...  . Drug use: No  . Sexual activity: No   Other Topics Concern  . Not on file   Social History Narrative  . No narrative on file    Family History  Problem Relation Age of Onset  . Cancer Father     lung  . Breast cancer Maternal Grandmother   . Hypertension Maternal Grandmother   . Diabetes Maternal Grandmother   . Cancer Maternal Aunt 56    uterine cancer-  rare type of ca    BP 107/72   Pulse 80   Ht 5\' 6"  (1.676 m)   Wt 185 lb (83.9 kg)   LMP 01/23/2015   BMI 29.86 kg/m   Review of Systems: See HPI above.    Objective:  Physical Exam:  Gen: NAD, comfortable in exam room  Left knee: No gross deformity, ecchymoses, effusion. TTP minimally tibial tubercle.  No joint line or other tenderness. FROM. Negative ant/post drawers. Negative valgus/varus testing. Negative lachmanns. Negative apleys. NV intact distally.  Left 3rd digit: Able to flex and extend at MCP, PIP, DIP joints with 5/5 strength. No malrotation or angulation Excellent healing  of abrasions/lacerations dorsal aspect of this and 4th digit.  Some associated swelling.  No redness, drainage.  Left wrist: No gross deformity, swelling, bruising. TTP 1st dorsal compartment.  No other tenderness of wrist or hand. FROM digits. + finkelsteins. nvi distally.    Assessment & Plan:  1. Left knee injury - 2/2 contusion and traumatic patellar tendinitis.  Doing well.  Icing, nsaids if needed.  2. Left finger abrasions/lacerations - appears as though she will have scarring here.  no evidence infection, tendon injury.  Expect swelling to go down some more still.  F/u prn for this.  3. Left wrist  dequervains tenosynovitis - She will start with thumb spica wrist brace.  Heat as needed.  Tylenol, motrin if needed.  Consider injection if not improving over next month to 6 weeks.

## 2016-07-25 ENCOUNTER — Encounter: Payer: Self-pay | Admitting: Gynecology

## 2016-07-25 ENCOUNTER — Ambulatory Visit (INDEPENDENT_AMBULATORY_CARE_PROVIDER_SITE_OTHER): Payer: 59 | Admitting: Gynecology

## 2016-07-25 VITALS — BP 118/76 | Ht 65.5 in | Wt 188.0 lb

## 2016-07-25 DIAGNOSIS — Z780 Asymptomatic menopausal state: Secondary | ICD-10-CM | POA: Diagnosis not present

## 2016-07-25 DIAGNOSIS — Z01419 Encounter for gynecological examination (general) (routine) without abnormal findings: Secondary | ICD-10-CM

## 2016-07-25 DIAGNOSIS — M654 Radial styloid tenosynovitis [de Quervain]: Secondary | ICD-10-CM | POA: Insufficient documentation

## 2016-07-25 NOTE — Assessment & Plan Note (Addendum)
Left finger abrasions/lacerations - appears as though she will have scarring here.  no evidence infection, tendon injury.  Expect swelling to go down some more still.  F/u prn for this.

## 2016-07-25 NOTE — Assessment & Plan Note (Signed)
She will start with thumb spica wrist brace.  Heat as needed.  Tylenol, motrin if needed.  Consider injection if not improving over next month to 6 weeks.

## 2016-07-25 NOTE — Progress Notes (Signed)
Becky Brown 12-17-1965 [id number]   History:    50 y.o.  for annual gyn exam who is complaining on and off of hot flashes. Her last menstrual cycle was over a year ago but patient does not want to go on any hormone replacement therapy.In 2014 her [place] was found to be in the menopausal range.Patient with past history of infertility and recurrent pregnancy losses. Patient using no contraception currently. Patient several years ago had history of dysplasia treated by cryo-and subsequent Pap smears have been normal. The patient in 2002 at abdominal myomectomy. Patient had a colonoscopy in 2016 which was normal.  Past medical history,surgical history, family history and social history were all reviewed and documented in the EPIC chart.  Gynecologic History Patient's last menstrual period was 01/23/2015. Contraception: post menopausal status Last Pap: 2012 and 2014. Results were: normal Last mammogram: 2017. Results were: Normal three-dimensional mammogram  Obstetric History OB History  Gravida Para Term Preterm AB Living  3 0     3 0  SAB TAB Ectopic Multiple Live Births  3            # Outcome Date GA Lbr Len/2nd Weight Sex Delivery Anes PTL Lv  3 SAB           2 SAB           1 SAB                ROS: A ROS was performed and pertinent positives and negatives are included in the history.  GENERAL: No fevers or chills. HEENT: No change in vision, no earache, sore throat or sinus congestion. NECK: No pain or stiffness. CARDIOVASCULAR: No chest pain or pressure. No palpitations. PULMONARY: No shortness of breath, cough or wheeze. GASTROINTESTINAL: No abdominal pain, nausea, vomiting or diarrhea, melena or bright red blood per rectum. GENITOURINARY: No urinary frequency, urgency, hesitancy or dysuria. MUSCULOSKELETAL: No joint or muscle pain, no back pain, no recent trauma. DERMATOLOGIC: No rash, no itching, no lesions. ENDOCRINE: No polyuria, polydipsia, no heat or cold intolerance. No  recent change in weight. HEMATOLOGICAL: No anemia or easy bruising or bleeding. NEUROLOGIC: No headache, seizures, numbness, tingling or weakness. PSYCHIATRIC: No depression, no loss of interest in normal activity or change in sleep pattern.     Exam: chaperone present  BP 118/76   Ht 5' 5.5" (1.664 m)   Wt 188 lb (85.3 kg)   LMP 01/23/2015   BMI 30.81 kg/m   Body mass index is 30.81 kg/m.  General appearance : Well developed well nourished female. No acute distress HEENT: Eyes: no retinal hemorrhage or exudates,  Neck supple, trachea midline, no carotid bruits, no thyroidmegaly Lungs: Clear to auscultation, no rhonchi or wheezes, or rib retractions  Heart: Regular rate and rhythm, no murmurs or gallops Breast:Examined in sitting and supine position were symmetrical in appearance, no palpable masses or tenderness,  no skin retraction, no nipple inversion, no nipple discharge, no skin discoloration, no axillary or supraclavicular lymphadenopathy Abdomen: no palpable masses or tenderness, no rebound or guarding Extremities: no edema or skin discoloration or tenderness  Pelvic:  Bartholin, Urethra, Skene Glands: Within normal limits             Vagina: No gross lesions or discharge  Cervix: No gross lesions or discharge  Uterus  anteverted, normal size, shape and consistency, non-tender and mobile  Adnexa  Without masses or tenderness  Anus and perineum  normal   Rectovaginal  normal  sphincter tone without palpated masses or tenderness             Hemoccult cards will be provided     Assessment/Plan:  50 y.o. female for annual exam menopausal now for over year not interested in hormone replacement therapy. For her scattered vasomotor symptoms I have recommended over-the-counter peppermint oil to apply transdermally when necessary. Pap smear done today. 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 She is provided with fecal Hemoccult cards to submit to the office for testing. Patient to  schedule baseline bone density study.   Terrance Mass MD, 10:06 AM 07/25/2016

## 2016-07-25 NOTE — Assessment & Plan Note (Signed)
2/2 contusion and traumatic patellar tendinitis.  Doing well.  Icing, nsaids if needed.

## 2016-07-25 NOTE — Addendum Note (Signed)
Addended by: Thamas Jaegers on: 07/25/2016 12:44 PM   Modules accepted: Orders

## 2016-07-25 NOTE — Patient Instructions (Signed)

## 2016-07-26 DIAGNOSIS — Z0000 Encounter for general adult medical examination without abnormal findings: Secondary | ICD-10-CM | POA: Diagnosis not present

## 2016-07-26 DIAGNOSIS — H9319 Tinnitus, unspecified ear: Secondary | ICD-10-CM | POA: Diagnosis not present

## 2016-07-26 DIAGNOSIS — R635 Abnormal weight gain: Secondary | ICD-10-CM | POA: Diagnosis not present

## 2016-07-26 LAB — PAP, TP IMAGING W/ HPV RNA, RFLX HPV TYPE 16,18/45: HPV MRNA, HIGH RISK: NOT DETECTED

## 2016-08-22 ENCOUNTER — Other Ambulatory Visit: Payer: Self-pay | Admitting: Gynecology

## 2016-08-22 DIAGNOSIS — Z13820 Encounter for screening for osteoporosis: Secondary | ICD-10-CM

## 2016-09-01 ENCOUNTER — Ambulatory Visit (INDEPENDENT_AMBULATORY_CARE_PROVIDER_SITE_OTHER): Payer: 59

## 2016-09-01 DIAGNOSIS — Z13820 Encounter for screening for osteoporosis: Secondary | ICD-10-CM | POA: Diagnosis not present

## 2016-10-19 ENCOUNTER — Encounter: Payer: Self-pay | Admitting: Family Medicine

## 2016-10-19 ENCOUNTER — Ambulatory Visit (INDEPENDENT_AMBULATORY_CARE_PROVIDER_SITE_OTHER): Payer: 59 | Admitting: Family Medicine

## 2016-10-19 VITALS — BP 123/87 | HR 78 | Ht 66.0 in | Wt 182.0 lb

## 2016-10-19 DIAGNOSIS — M654 Radial styloid tenosynovitis [de Quervain]: Secondary | ICD-10-CM

## 2016-10-19 MED ORDER — METHYLPREDNISOLONE ACETATE 40 MG/ML IJ SUSP
20.0000 mg | Freq: Once | INTRAMUSCULAR | Status: AC
  Administered 2016-10-19: 20 mg via INTRA_ARTICULAR

## 2016-10-19 NOTE — Patient Instructions (Signed)
Wear the thumb spica brace as often as possible over the next week. Heat or ice 15 minutes at a time 3-4 times a day, whichever feels better. Follow up with me in 1 month or as needed.

## 2016-10-21 NOTE — Assessment & Plan Note (Signed)
1st dorsal compartment injection performed today.  Continue thumb spica over next week then as needed.  Heat as needed.  Tylenol, motrin if needed.  F/u prn.  After informed written consent patient was seated in chair in exam room.  Area overlying 1st dorsal compartment of left wrist prepped with alcohol swab then injected with 0.5:0.62mL marcaine: depomedrol.  Patient tolerated procedure well without immediate complications.

## 2016-10-21 NOTE — Progress Notes (Signed)
PCP: No primary care provider on file.  Subjective:   HPI: Patient is a 50 y.o. female here for left knee, hand injuries.  6/30: Patient reports on 6/28 she was pushing a trash can uphill when it turned over causing patient to fall - landed directly on left knee and badly scraped middle 3 fingers on left hand dorsally. Radiographs were negative. Has been using neosporin with gauze, changing these daily on digits. Wearing immobilizer left knee. No prior injuries. Pain is anterior, 4/10 and sharp. Worse with ambulation, pressing on area. No other skin changes, fever, numbness.  7/10: Patient reports she is doing well. Still with some anterior knee pain 4/10 level. Has been doing home exercises, taking naproxen as needed. Fingers healing well also without redness, streaking. No fevers, skin changes otherwise, numbness.  8/10: Patient returns with continued improvement since last visit. Some pain in right wrist on radial aspect, worse with movement of thumb. Lacerations, abrasions healing well. Pain up to 4/10 with thumb motions, sharp. Some soreness only with pushing on area of pain of knee. No other skin changes, drainage. No longer wrapping fingers.  11/8: Patient reports her left wrist has continued to hurt. Pain is 3/10 at rest, up to 7/10 and sharp with any thumb motions. Difficulty lifting items. Left handed. No skin changes, numbness.  Past Medical History:  Diagnosis Date  . Fibroid   . Hearing loss    right ear    Current Outpatient Prescriptions on File Prior to Visit  Medication Sig Dispense Refill  . Multiple Vitamin (MULTIVITAMIN) capsule Take by mouth.    . triamterene-hydrochlorothiazide (DYAZIDE) 37.5-25 MG capsule Take 1 capsule by mouth daily.  3   No current facility-administered medications on file prior to visit.     Past Surgical History:  Procedure Laterality Date  . DILATION AND CURETTAGE OF UTERUS     x2  . EXPLORATORY LAPAROTOMY   06/18/2001   with multiple myomectomy  . HYSTEROSCOPY    . PELVIC LAPAROSCOPY    . TONSILLECTOMY  06/18/2001    Allergies  Allergen Reactions  . Erythromycin     Social History   Social History  . Marital status: Married    Spouse name: N/A  . Number of children: N/A  . Years of education: N/A   Occupational History  . Not on file.   Social History Main Topics  . Smoking status: Never Smoker  . Smokeless tobacco: Never Used  . Alcohol use 0.0 oz/week     Comment: 3 glasses of wine a year...  . Drug use: No  . Sexual activity: No   Other Topics Concern  . Not on file   Social History Narrative  . No narrative on file    Family History  Problem Relation Age of Onset  . Cancer Father     lung  . Breast cancer Maternal Grandmother   . Hypertension Maternal Grandmother   . Diabetes Maternal Grandmother   . Cancer Maternal Aunt 56    uterine cancer-  rare type of ca    BP 123/87   Pulse 78   Ht 5\' 6"  (1.676 m)   Wt 182 lb (82.6 kg)   LMP 01/23/2015   BMI 29.38 kg/m   Review of Systems: See HPI above.    Objective:  Physical Exam:  Gen: NAD, comfortable in exam room  Left wrist: No gross deformity, swelling, bruising. TTP 1st dorsal compartment.  No other tenderness of wrist or hand. FROM digits. +  finkelsteins. nvi distally.    Assessment & Plan:  1. Left wrist dequervains tenosynovitis - 1st dorsal compartment injection performed today.  Continue thumb spica over next week then as needed.  Heat as needed.  Tylenol, motrin if needed.  F/u prn.  After informed written consent patient was seated in chair in exam room.  Area overlying 1st dorsal compartment of left wrist prepped with alcohol swab then injected with 0.5:0.7mL marcaine: depomedrol.  Patient tolerated procedure well without immediate complications.

## 2017-02-13 DIAGNOSIS — R1013 Epigastric pain: Secondary | ICD-10-CM | POA: Diagnosis not present

## 2017-02-15 ENCOUNTER — Other Ambulatory Visit (HOSPITAL_COMMUNITY): Payer: Self-pay | Admitting: Family Medicine

## 2017-02-15 DIAGNOSIS — R1012 Left upper quadrant pain: Secondary | ICD-10-CM

## 2017-02-15 DIAGNOSIS — R1013 Epigastric pain: Secondary | ICD-10-CM

## 2017-02-15 DIAGNOSIS — R1010 Upper abdominal pain, unspecified: Secondary | ICD-10-CM

## 2017-02-17 ENCOUNTER — Ambulatory Visit (HOSPITAL_COMMUNITY): Payer: 59

## 2017-02-23 ENCOUNTER — Ambulatory Visit (HOSPITAL_COMMUNITY)
Admission: RE | Admit: 2017-02-23 | Discharge: 2017-02-23 | Disposition: A | Discharge status: Home / Self Care | Payer: 59 | Source: Ambulatory Visit | Attending: Family Medicine | Admitting: Family Medicine

## 2017-02-23 DIAGNOSIS — R1012 Left upper quadrant pain: Secondary | ICD-10-CM | POA: Insufficient documentation

## 2017-02-23 DIAGNOSIS — K7689 Other specified diseases of liver: Secondary | ICD-10-CM | POA: Insufficient documentation

## 2017-02-23 DIAGNOSIS — R932 Abnormal findings on diagnostic imaging of liver and biliary tract: Secondary | ICD-10-CM | POA: Insufficient documentation

## 2017-02-23 DIAGNOSIS — R1013 Epigastric pain: Secondary | ICD-10-CM | POA: Diagnosis not present

## 2017-04-26 ENCOUNTER — Encounter: Payer: Self-pay | Admitting: Gynecology

## 2017-05-29 DIAGNOSIS — H9190 Unspecified hearing loss, unspecified ear: Secondary | ICD-10-CM | POA: Diagnosis not present

## 2017-06-20 DIAGNOSIS — H9041 Sensorineural hearing loss, unilateral, right ear, with unrestricted hearing on the contralateral side: Secondary | ICD-10-CM | POA: Diagnosis not present

## 2017-06-20 DIAGNOSIS — H905 Unspecified sensorineural hearing loss: Secondary | ICD-10-CM | POA: Diagnosis not present

## 2017-06-20 DIAGNOSIS — H9311 Tinnitus, right ear: Secondary | ICD-10-CM | POA: Diagnosis not present

## 2017-06-20 DIAGNOSIS — H6122 Impacted cerumen, left ear: Secondary | ICD-10-CM | POA: Diagnosis not present

## 2017-07-26 ENCOUNTER — Encounter: Payer: 59 | Admitting: Gynecology

## 2017-08-04 IMAGING — DX DG HAND COMPLETE 3+V*L*
4 series · 4 of 4 positions shown · non-contrast
Comparison: None.

CLINICAL DATA: Pain after trauma.

EXAM:
LEFT HAND - COMPLETE 3+ VIEW

[hand pa]
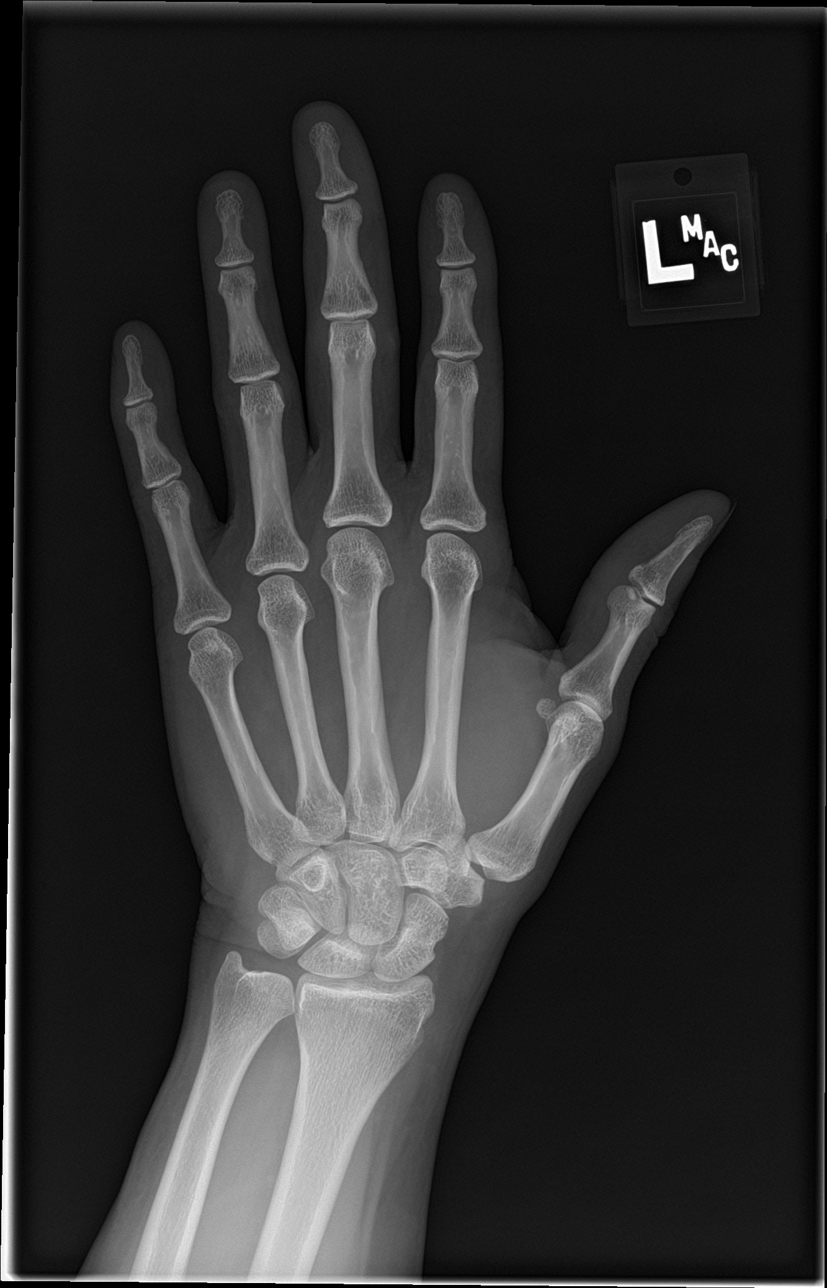

[hand obl]
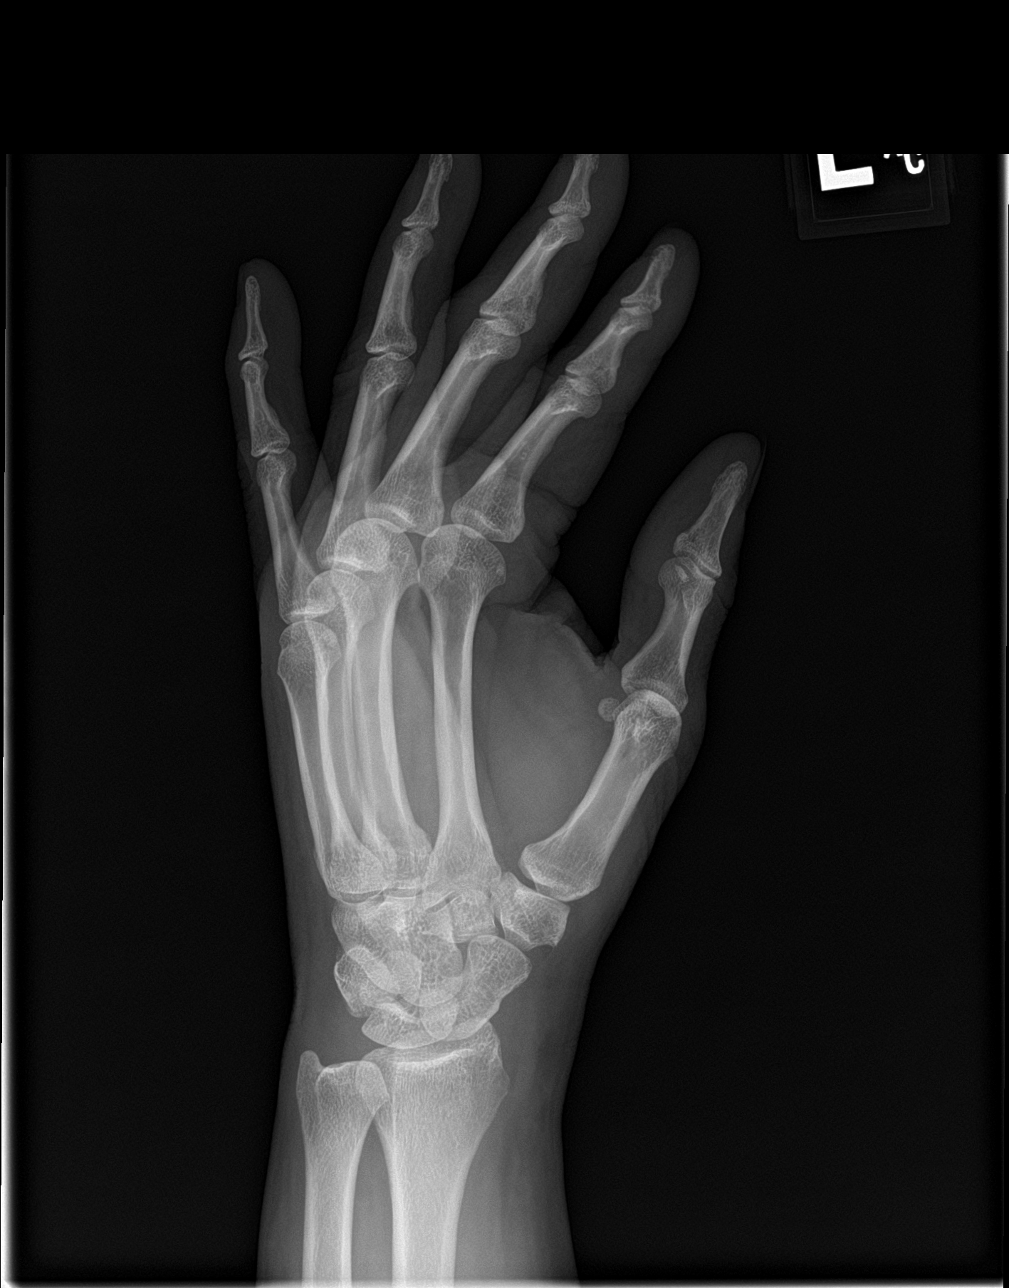

[hand lat (1 of 2)]
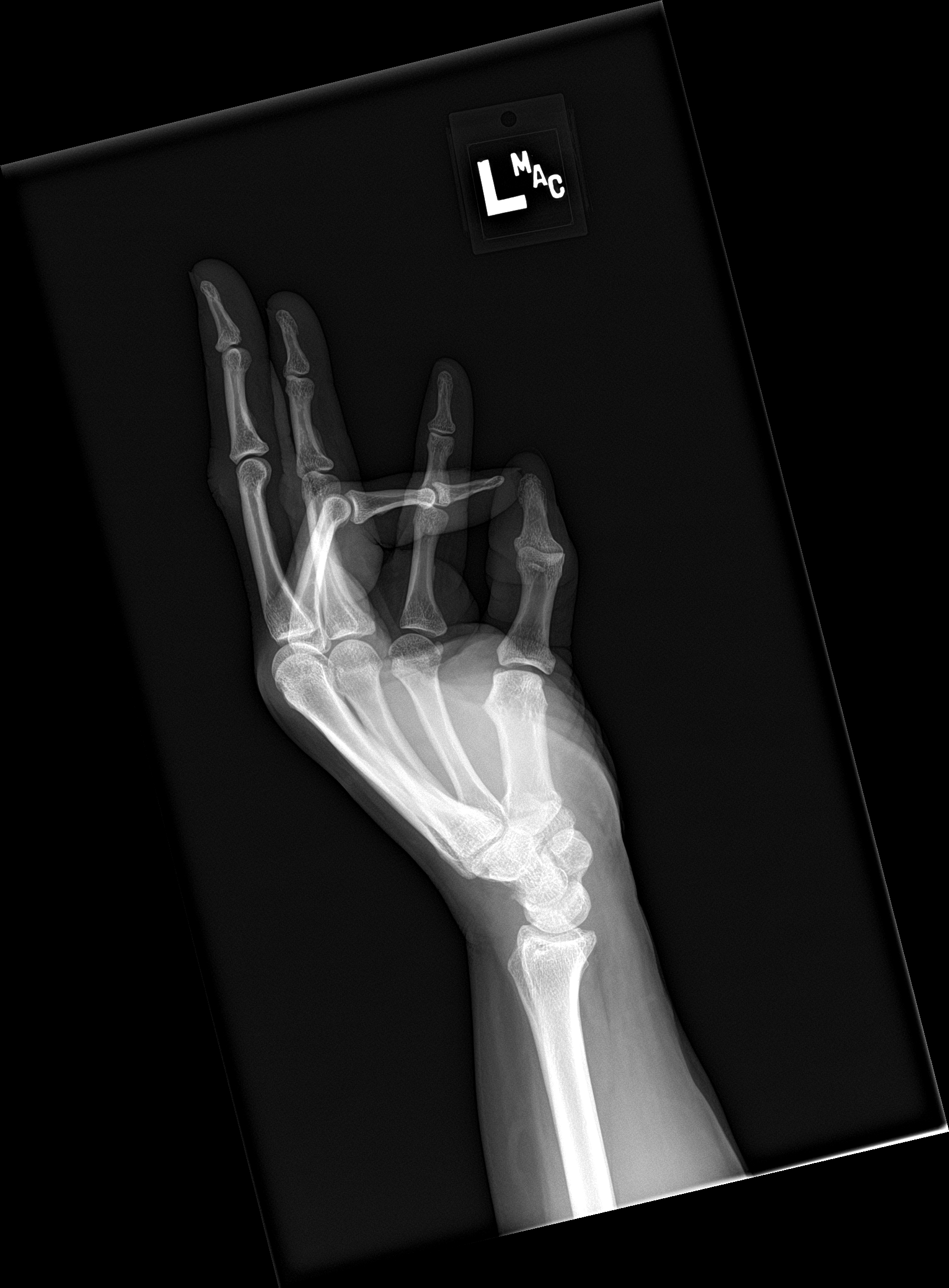

[hand lat (2 of 2)]
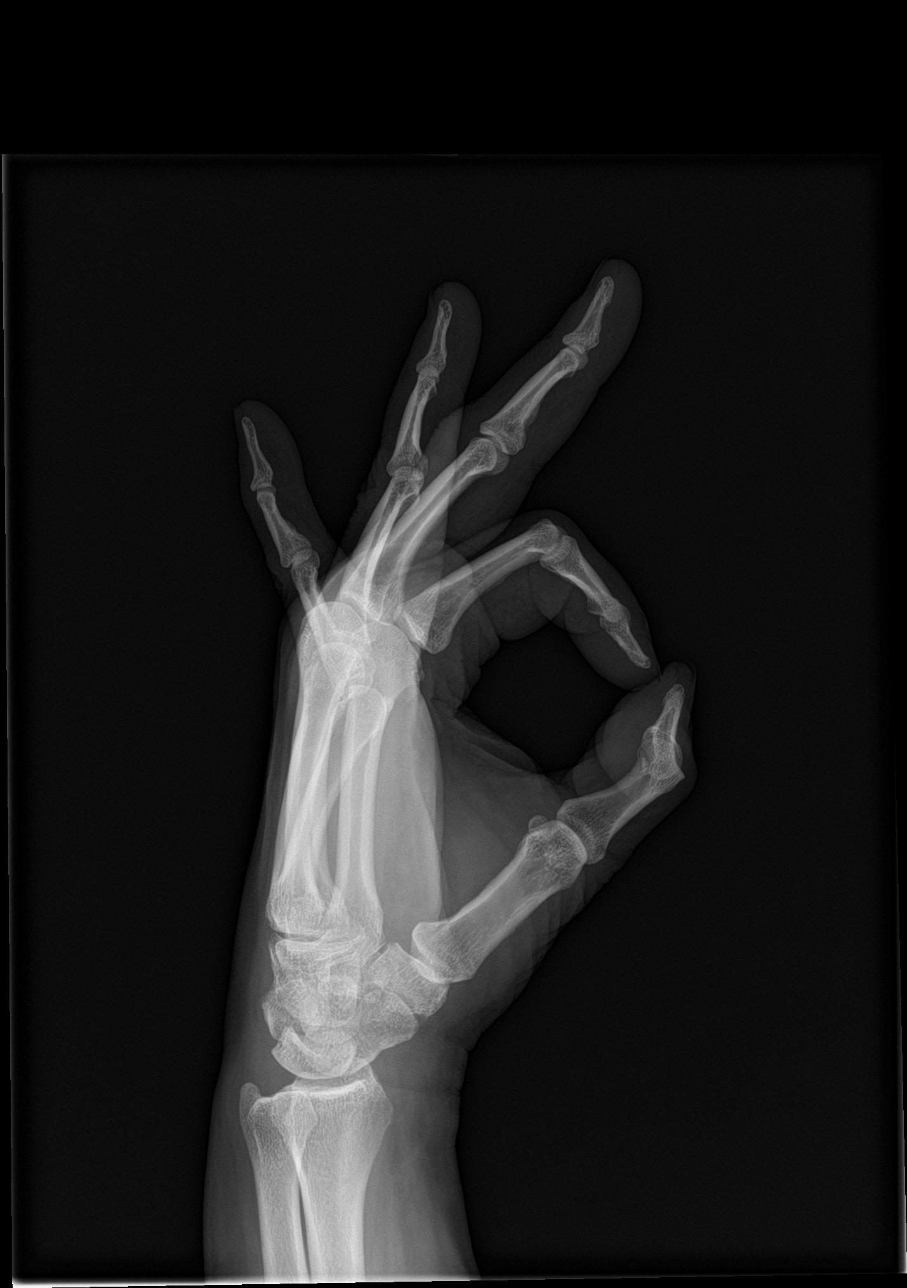

[4 of 4 positions shown; findings below may reference images not displayed]

FINDINGS: There is no evidence of fracture or dislocation. There is no
evidence of arthropathy or other focal bone abnormality. Soft
tissues are unremarkable.
IMPRESSION: Negative.

## 2017-08-04 IMAGING — DX DG KNEE COMPLETE 4+V*L*
4 series · 4 of 4 positions shown · non-contrast
Comparison: None.

CLINICAL DATA: Pain after fall

EXAM:
LEFT KNEE - COMPLETE 4+ VIEW

[knee ap]
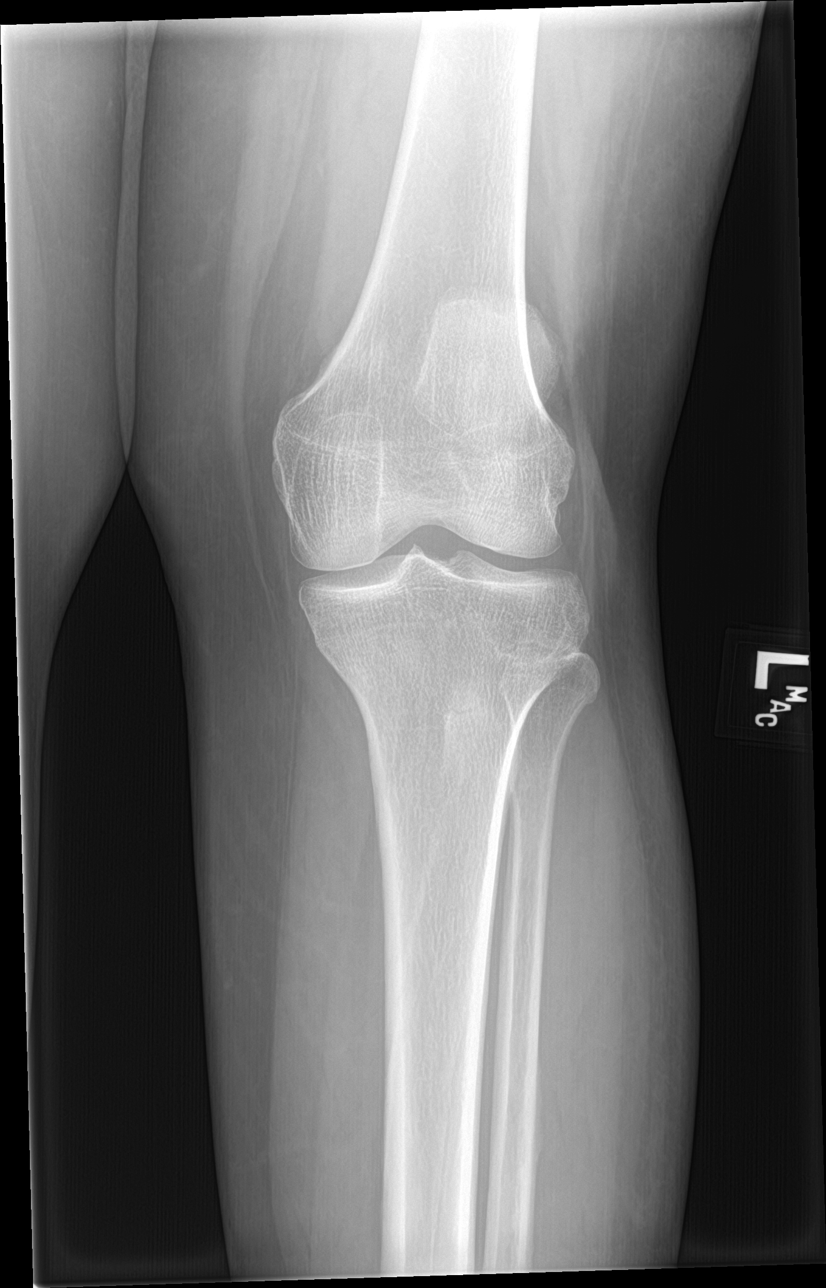

[tunnel]
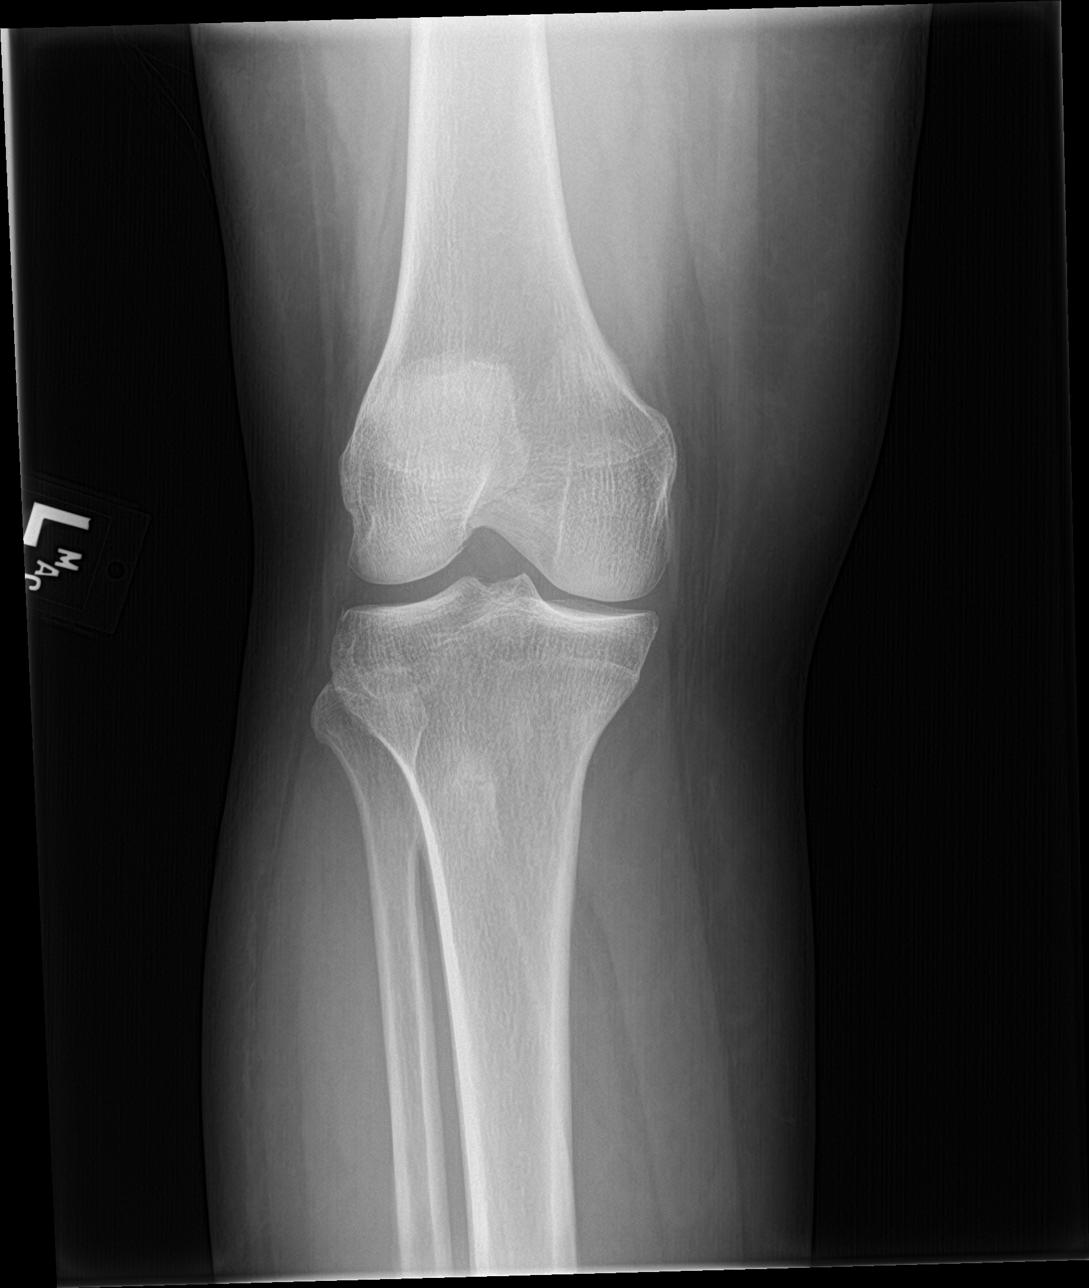

[knee lat]
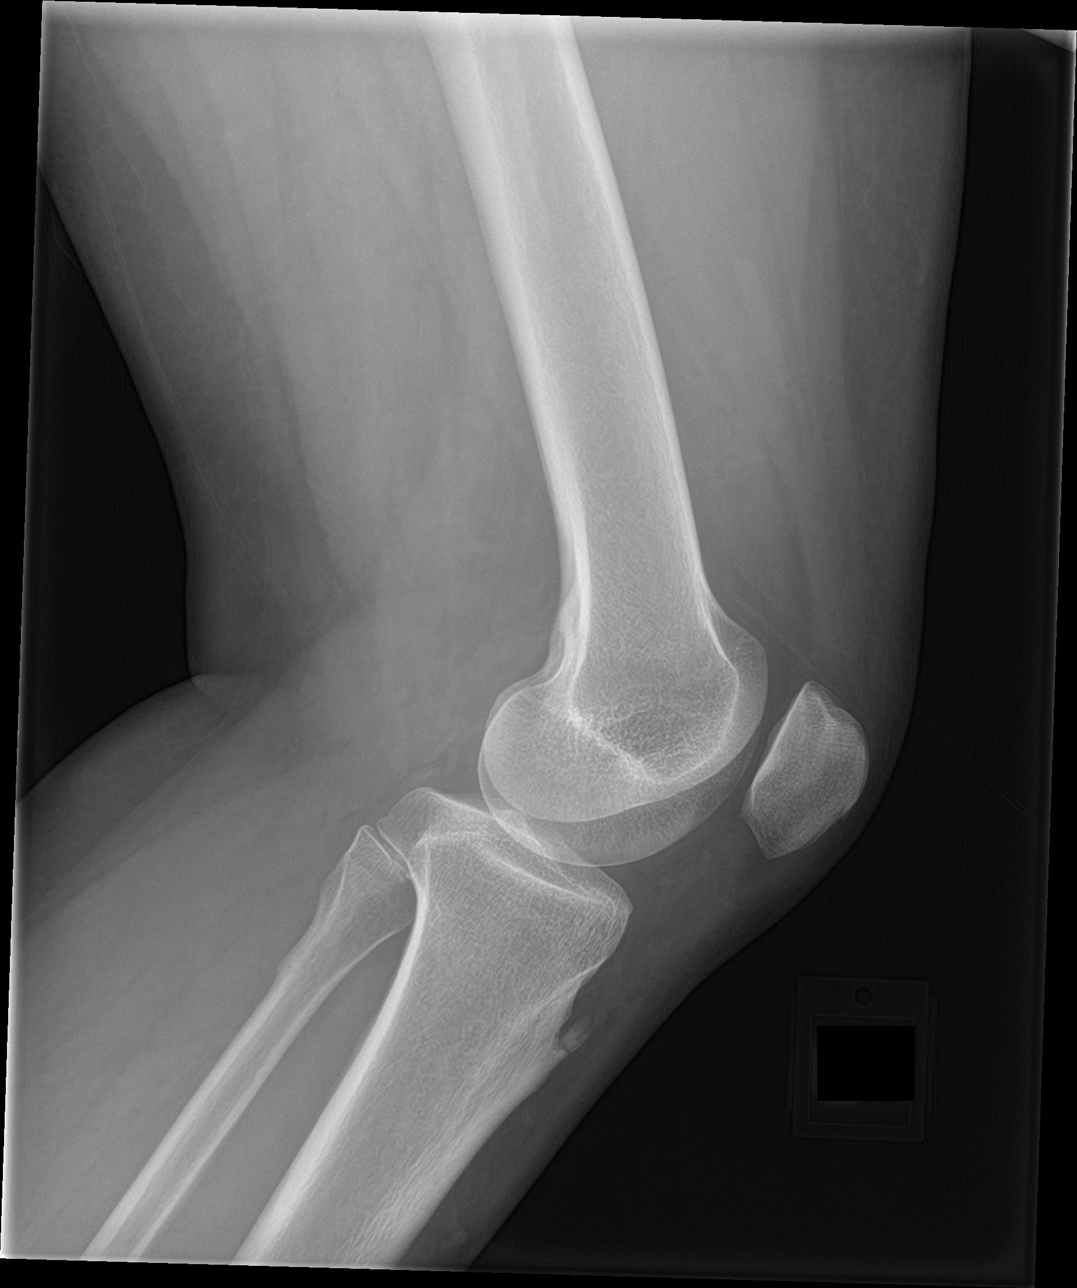

[knee sunrise]
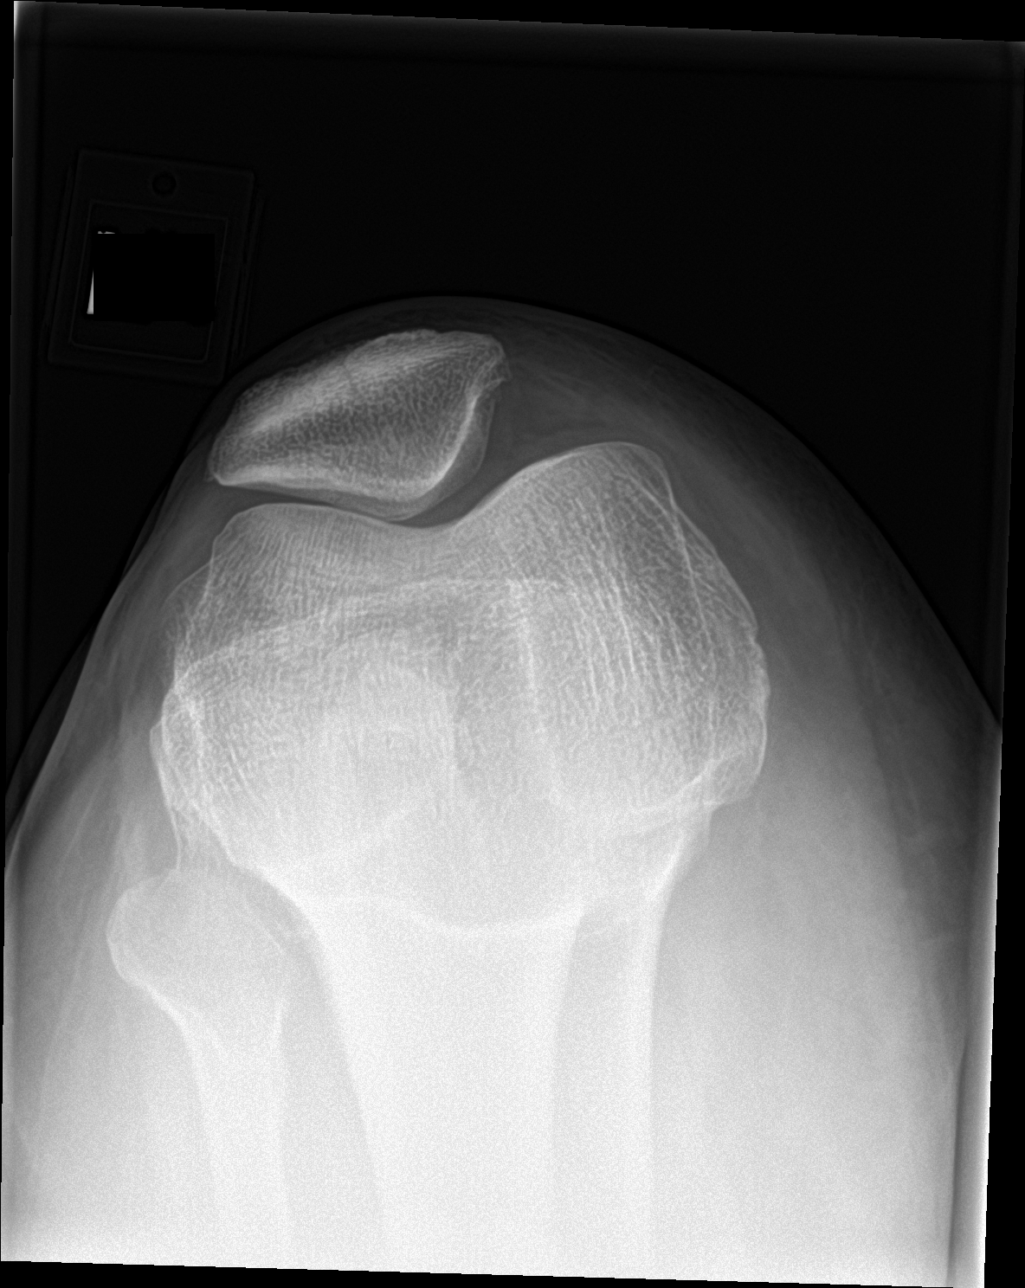

[4 of 4 positions shown; findings below may reference images not displayed]

FINDINGS: Increased attenuation is seen in Hoffa's fat, inferior to the
patella. This may be posttraumatic. There is no significant anterior
soft tissue swelling. There is irregularity at the tibial tuberosity
with a lucency at its base. However, there is no focal overlying
soft tissue swelling. No other evidence of fracture. Suggested small
suprapatellar effusion.
IMPRESSION: Lucency through the base of the tibial tuberosity with no focal
overlying soft tissue swelling. I suspect this may be developmental
rather than posttraumatic. Recommend clinical correlation to exclude
point tenderness in this location. Small joint effusion.

## 2017-08-07 ENCOUNTER — Other Ambulatory Visit: Payer: Self-pay | Admitting: Gynecology

## 2017-08-07 DIAGNOSIS — Z1231 Encounter for screening mammogram for malignant neoplasm of breast: Secondary | ICD-10-CM

## 2017-08-11 ENCOUNTER — Encounter: Payer: 59 | Admitting: Gynecology

## 2017-08-17 ENCOUNTER — Ambulatory Visit
Admission: RE | Admit: 2017-08-17 | Discharge: 2017-08-17 | Disposition: A | Discharge status: Home / Self Care | Payer: 59 | Source: Ambulatory Visit | Attending: Gynecology | Admitting: Gynecology

## 2017-08-17 DIAGNOSIS — Z1231 Encounter for screening mammogram for malignant neoplasm of breast: Secondary | ICD-10-CM

## 2017-09-19 ENCOUNTER — Encounter: Payer: 59 | Admitting: Gynecology

## 2017-10-31 ENCOUNTER — Ambulatory Visit (INDEPENDENT_AMBULATORY_CARE_PROVIDER_SITE_OTHER): Payer: 59 | Admitting: Gynecology

## 2017-10-31 ENCOUNTER — Encounter: Payer: Self-pay | Admitting: Gynecology

## 2017-10-31 VITALS — BP 116/76 | Ht 66.0 in | Wt 188.0 lb

## 2017-10-31 DIAGNOSIS — Z01419 Encounter for gynecological examination (general) (routine) without abnormal findings: Secondary | ICD-10-CM

## 2017-10-31 NOTE — Patient Instructions (Signed)
Follow-up in 1 year for annual exam, sooner if any issues. 

## 2017-10-31 NOTE — Progress Notes (Signed)
    CLAIRE DOLORES 07/24/1966 [id number]        51 y.o.  G3P0030 for annual gynecologic exam.  Former patient of Dr. Toney Rakes.  Doing well without complaints.  Has not had a menses in several years.  Having some hot flushes and night sweats but dealing with them with OTC products.  Past medical history,surgical history, problem list, medications, allergies, family history and social history were all reviewed and documented as reviewed in the EPIC chart.  ROS:  Performed with pertinent positives and negatives included in the history, assessment and plan.   Additional significant findings : None   Exam: Caryn Bee assistant Vitals:   10/31/17 1520  BP: 116/76  Weight: 188 lb (85.3 kg)  Height: 5\' 6"  (1.676 m)   Body mass index is 30.34 kg/m.  General appearance:  Normal affect, orientation and appearance. Skin: Grossly normal HEENT: Without gross lesions.  No cervical or supraclavicular adenopathy. Thyroid normal.  Lungs:  Clear without wheezing, rales or rhonchi Cardiac: RR, without RMG Abdominal:  Soft, nontender, without masses, guarding, rebound, organomegaly or hernia Breasts:  Examined lying and sitting without masses, retractions, discharge or axillary adenopathy. Pelvic:  Ext, BUS, Vagina: Normal  Cervix: Normal  Uterus: Anteverted, normal size, shape and contour, midline and mobile nontender   Adnexa: Without masses or tenderness    Anus and perineum: Normal   Rectovaginal: Normal sphincter tone without palpated masses or tenderness.    Assessment/Plan:  51 y.o. G48P0030 female for annual gynecologic exam.   1. Menopausal.  Doing well without significant hot flushes, night sweats, vaginal dryness or any vaginal bleeding.  Continue to monitor and report any issues or bleeding. 2. Pap smear/HPV 07/2016.  History of cryosurgery a number of years ago with normal Pap smears since.  Plan follow-up Pap smear at 5-year interval per current screening guidelines. 3. Mammography  08/2017.  Continue with annual mammography when due.  Breast exam normal today.  SBE monthly reviewed. 4. Colonoscopy 2016.  Repeat at their recommended interval. 5. DEXA 2017 normal.  Plan repeat DEXA at 5-10-year interval.  Increase calcium vitamin D. 6. Health maintenance.  No routine lab work done as patient does this elsewhere.  Follow-up in 1 year, sooner as needed.   Anastasio Auerbach MD, 4:11 PM 10/31/2017

## 2018-03-26 DIAGNOSIS — Z13220 Encounter for screening for lipoid disorders: Secondary | ICD-10-CM | POA: Diagnosis not present

## 2018-03-26 DIAGNOSIS — Z0000 Encounter for general adult medical examination without abnormal findings: Secondary | ICD-10-CM | POA: Diagnosis not present

## 2018-03-26 DIAGNOSIS — H9319 Tinnitus, unspecified ear: Secondary | ICD-10-CM | POA: Diagnosis not present

## 2018-03-26 DIAGNOSIS — Z79899 Other long term (current) drug therapy: Secondary | ICD-10-CM | POA: Diagnosis not present

## 2018-07-27 ENCOUNTER — Ambulatory Visit: Payer: 59 | Admitting: Obstetrics & Gynecology

## 2018-07-27 ENCOUNTER — Encounter: Payer: Self-pay | Admitting: Obstetrics & Gynecology

## 2018-07-27 VITALS — BP 128/86

## 2018-07-27 DIAGNOSIS — Z113 Encounter for screening for infections with a predominantly sexual mode of transmission: Secondary | ICD-10-CM

## 2018-07-27 DIAGNOSIS — R300 Dysuria: Secondary | ICD-10-CM

## 2018-07-27 NOTE — Progress Notes (Signed)
    Becky Brown Dec 07, 1966 [id number]        52 y.o.  G3P0030 single  RP: Complains of dysuria.  HPI: Had pain with passage of urine yesterday and this morning.  Drank a lot of water and has decreased some Tums currently.  No urinary frequency.  Unprotected intercourse recently.  No vaginal discharge.  No pelvic pain.  No fever.   OB History  Gravida Para Term Preterm AB Living  3 0     3 0  SAB TAB Ectopic Multiple Live Births  3            # Outcome Date GA Lbr Len/2nd Weight Sex Delivery Anes PTL Lv  3 SAB           2 SAB           1 SAB             Past medical history,surgical history, problem list, medications, allergies, family history and social history were all reviewed and documented in the EPIC chart.   Directed ROS with pertinent positives and negatives documented in the history of present illness/assessment and plan.  Exam:  Vitals:   07/27/18 1523  BP: 128/86   General appearance:  Normal  Abdomen: Normal  CVAT neg bilaterally  Gynecologic exam: Vulva normal.  Speculum:  Cervix/Vagina normal.  Gono-Chlam done on cervix.  Vaginal secretions normal.  U/A: Yellow clear, protein negative, nitrites negative, white blood cells negative, red blood cells 0-2, bacteria few.  Urine culture pending.    Assessment/Plan:  52 y.o. G3P0030   1. Dysuria Urine analysis very mildly perturbed.  Patient's symptoms have improved in the course of the day.  Decision to wait on urine culture to decide on treatment.  Recommend plenty of hydration with water. - Urinalysis,Complete w/RFL Culture  2. Screen for STD (sexually transmitted disease) Strict condom use recommended. - HIV antibody (with reflex) - RPR - Hepatitis C Antibody - Hepatitis B Surface AntiGEN - C. trachomatis/N. gonorrhoeae RNA  Counseling on above issues and coordination of care more than 50% for 15 minutes  Princess Bruins MD, 3:38 PM 07/27/2018

## 2018-07-27 NOTE — Patient Instructions (Signed)
1. Dysuria Urine analysis very mildly perturbed.  Patient's symptoms have improved in the course of the day.  Decision to wait on urine culture to decide on treatment.  Recommend plenty of hydration with water. - Urinalysis,Complete w/RFL Culture  2. Screen for STD (sexually transmitted disease) Strict condom use recommended. - HIV antibody (with reflex) - RPR - Hepatitis C Antibody - Hepatitis B Surface AntiGEN - C. trachomatis/N. gonorrhoeae RNA  Becky Brown, it was a pleasure meeting you today!  I will inform you of your results as soon as they are available.

## 2018-07-28 LAB — C. TRACHOMATIS/N. GONORRHOEAE RNA
C. TRACHOMATIS RNA, TMA: NOT DETECTED
N. gonorrhoeae RNA, TMA: NOT DETECTED

## 2018-07-29 LAB — URINE CULTURE
MICRO NUMBER: 90977315
SPECIMEN QUALITY: ADEQUATE

## 2018-07-30 LAB — HEPATITIS C ANTIBODY
Hepatitis C Ab: NONREACTIVE
SIGNAL TO CUT-OFF: 0.02 (ref <1.00)

## 2018-07-30 LAB — HEPATITIS B SURFACE ANTIGEN: HEP B S AG: NONREACTIVE

## 2018-07-30 LAB — RPR: RPR: NONREACTIVE

## 2018-07-30 LAB — HIV ANTIBODY (ROUTINE TESTING W REFLEX): HIV: NONREACTIVE

## 2018-07-31 LAB — URINALYSIS, COMPLETE W/RFL CULTURE
BILIRUBIN URINE: NEGATIVE
Glucose, UA: NEGATIVE
Hyaline Cast: NONE SEEN /LPF
KETONES UR: NEGATIVE
LEUKOCYTE ESTERASE: NEGATIVE
Nitrites, Initial: NEGATIVE
PH: 5 (ref 5.0–8.0)
Protein, ur: NEGATIVE
SPECIFIC GRAVITY, URINE: 1.022 (ref 1.001–1.03)
WBC UA: NONE SEEN /HPF (ref 0–5)

## 2018-07-31 LAB — URINE CULTURE

## 2018-07-31 LAB — CULTURE INDICATED

## 2018-09-17 ENCOUNTER — Other Ambulatory Visit: Payer: Self-pay | Admitting: Gynecology

## 2018-09-17 DIAGNOSIS — Z1231 Encounter for screening mammogram for malignant neoplasm of breast: Secondary | ICD-10-CM

## 2018-10-24 ENCOUNTER — Ambulatory Visit
Admission: RE | Admit: 2018-10-24 | Discharge: 2018-10-24 | Disposition: A | Discharge status: Home / Self Care | Payer: 59 | Source: Ambulatory Visit | Attending: Gynecology | Admitting: Gynecology

## 2018-10-24 DIAGNOSIS — Z1231 Encounter for screening mammogram for malignant neoplasm of breast: Secondary | ICD-10-CM

## 2018-11-01 ENCOUNTER — Ambulatory Visit (INDEPENDENT_AMBULATORY_CARE_PROVIDER_SITE_OTHER): Payer: 59 | Admitting: Gynecology

## 2018-11-01 ENCOUNTER — Encounter: Payer: Self-pay | Admitting: Gynecology

## 2018-11-01 VITALS — BP 130/82 | Ht 65.0 in | Wt 194.0 lb

## 2018-11-01 DIAGNOSIS — N952 Postmenopausal atrophic vaginitis: Secondary | ICD-10-CM | POA: Diagnosis not present

## 2018-11-01 DIAGNOSIS — N951 Menopausal and female climacteric states: Secondary | ICD-10-CM

## 2018-11-01 DIAGNOSIS — Z01419 Encounter for gynecological examination (general) (routine) without abnormal findings: Secondary | ICD-10-CM

## 2018-11-01 NOTE — Patient Instructions (Signed)
Follow-up if your menopausal symptoms worsen and you want to discuss hormone replacement therapy.  Follow-up in 1 year for annual exam.

## 2018-11-01 NOTE — Progress Notes (Signed)
    Becky Brown 1966-07-29 [id number]        52 y.o.  G3P0030 for annual gynecologic exam.  Having some hot flushes on and off.  Past medical history,surgical history, problem list, medications, allergies, family history and social history were all reviewed and documented as reviewed in the EPIC chart.  ROS:  Performed with pertinent positives and negatives included in the history, assessment and plan.   Additional significant findings : None   Exam: Caryn Bee assistant Vitals:   11/01/18 0944  BP: 130/82  Weight: 194 lb (88 kg)  Height: 5\' 5"  (1.651 m)   Body mass index is 32.28 kg/m.  General appearance:  Normal affect, orientation and appearance. Skin: Grossly normal HEENT: Without gross lesions.  No cervical or supraclavicular adenopathy. Thyroid normal.  Lungs:  Clear without wheezing, rales or rhonchi Cardiac: RR, without RMG Abdominal:  Soft, nontender, without masses, guarding, rebound, organomegaly or hernia Breasts:  Examined lying and sitting without masses, retractions, discharge or axillary adenopathy. Pelvic:  Ext, BUS, Vagina: Normal with mild atrophic changes  Cervix: Normal with mild atrophic changes  Uterus: Anteverted, normal size, shape and contour, midline and mobile nontender   Adnexa: Without masses or tenderness    Anus and perineum: Normal   Rectovaginal: Normal sphincter tone without palpated masses or tenderness.    Assessment/Plan:  52 y.o. G37P0030 female for annual gynecologic exam.   1. Postmenopausal/mild atrophic changes.  Is having menopausal symptoms with hot flushes and sweats 4-5 times daily.  We discussed options to include OTC products up to and including HRT.  Patient does not want to consider HRT at this time.  She is going to try OTC products to see if it helps.  She will follow-up if she wants to rediscuss HRT.  No bleeding and she knows to call if any bleeding. 2. Mammography 10/2018.  Continue with annual mammography next year.   Breast exam normal today. 3. DEXA 2017 normal.  Plan repeat DEXA at 5-year interval. 4. Pap smear/HPV 07/2016.  No Pap smear done today.  History of cryosurgery a number of years ago with normal Pap smears since.  Plan repeat Pap smear/HPV at 5-year interval per current screening guidelines. 5. Colonoscopy 2016.  Repeat at their recommended interval. 6. Health maintenance.  No routine lab work done as patient does this elsewhere.  Follow-up 1 year, sooner as needed.   Anastasio Auerbach MD, 10:08 AM 11/01/2018

## 2019-02-21 DIAGNOSIS — H900 Conductive hearing loss, bilateral: Secondary | ICD-10-CM | POA: Diagnosis not present

## 2019-02-21 DIAGNOSIS — Z79899 Other long term (current) drug therapy: Secondary | ICD-10-CM | POA: Diagnosis not present

## 2019-02-21 DIAGNOSIS — H6123 Impacted cerumen, bilateral: Secondary | ICD-10-CM | POA: Diagnosis not present

## 2019-09-04 ENCOUNTER — Encounter: Payer: Self-pay | Admitting: Gynecology

## 2019-12-19 ENCOUNTER — Other Ambulatory Visit: Payer: Self-pay | Admitting: Gynecology

## 2019-12-19 ENCOUNTER — Other Ambulatory Visit: Payer: Self-pay | Admitting: Family Medicine

## 2019-12-19 DIAGNOSIS — Z1231 Encounter for screening mammogram for malignant neoplasm of breast: Secondary | ICD-10-CM

## 2020-01-27 ENCOUNTER — Other Ambulatory Visit: Payer: Self-pay

## 2020-01-27 ENCOUNTER — Ambulatory Visit
Admission: RE | Admit: 2020-01-27 | Discharge: 2020-01-27 | Disposition: A | Discharge status: Home / Self Care | Payer: 59 | Source: Ambulatory Visit | Attending: Family Medicine | Admitting: Family Medicine

## 2020-01-27 DIAGNOSIS — Z1231 Encounter for screening mammogram for malignant neoplasm of breast: Secondary | ICD-10-CM

## 2020-03-12 ENCOUNTER — Ambulatory Visit: Payer: 59 | Attending: Internal Medicine

## 2020-03-12 DIAGNOSIS — Z23 Encounter for immunization: Secondary | ICD-10-CM

## 2020-03-12 NOTE — Progress Notes (Signed)
   Covid-19 Vaccination [facility]  Name:  Becky Brown    MRN: [id number] DOB: May 11, 1966  03/12/2020  Becky Brown was observed post Covid-19 immunization for 15 minutes without incident. She was provided with Vaccine Information Sheet and instruction to access the V-Safe system.   Becky Brown was instructed to call 911 with any severe reactions post vaccine: Marland Kitchen Difficulty breathing  . Swelling of face and throat  . A fast heartbeat  . A bad rash all over body  . Dizziness and weakness   Immunizations Administered    Name Date Dose VIS Date Route   Pfizer COVID-19 Vaccine 03/12/2020  4:05 PM 0.3 mL 11/22/2019 Intramuscular   Manufacturer: [employer]   Lot: [id number]   [place]: [id number]

## 2020-04-07 ENCOUNTER — Ambulatory Visit: Payer: 59

## 2020-04-17 ENCOUNTER — Encounter: Payer: Self-pay | Admitting: Neurology

## 2020-07-09 ENCOUNTER — Ambulatory Visit: Payer: 59 | Admitting: Neurology

## 2020-09-15 NOTE — Progress Notes (Signed)
NEUROLOGY CONSULTATION NOTE  KEMISHA Becky Brown MRN: [id number] DOB: 1966-02-09  Referring provider: Vicie Mutters, MD Primary care provider: Gaynelle Arabian, MD  Reason for consult:  Idiopathic intracranial hypertension  HISTORY OF PRESENT ILLNESS: Becky Brown. Becky Brown is a 54 year old right-handed female who presents for evaluation of idiopathic intracranial hypertension.  History supplemented by referring provider's notes.  She reports sudden hearing loss in her left ear after receiving her first dose of the Phizer Covid-19 vaccine on April 1.  About 3 weeks later, she developed a static white nomise sound in her left ear.  She has prior history of severe sensorineural hearing loss in the right ear after receiving the H1N1 vaccine, as demonstrated on audiogram in 2016,with normal hearing in left ear.  Audiogram from April 22 showed mild sensorineural hearing loss in the left ear with 10-30 dB decline in all frequencies since 2016.  She was seen by Dr, Thornell Mule, ENT.  She was prescribed a course of prednisone and the hearing in the left ear improved.  MRI of brain with and without contrast on 04/07/2020 personally reviewed showed partially empty sella suspected tiny cephalocele in the region of the right foramen ovale, and possible transverse sinus narrowing.  Concern for idiopathic intracranial hypertension.  She does not have history of significant headaches, but once in a while she may have pressure in the back of her head lasting a second or two and occurs about every 2 to 3 months, usually triggered by stress.  She denies any visual disturbance but thinks she may need readers and plans to see an ophthalmologist.  Last eye exam was 2 or 3 years ago.  While she hears a static white noise in her right ear, she does not exhibit any actual pulsatile tinnitus.  No dizziness.    PAST MEDICAL HISTORY: Past Medical History:  Diagnosis Date  . Fibroid   . Hearing loss    right ear    PAST SURGICAL  HISTORY: Past Surgical History:  Procedure Laterality Date  . DILATION AND CURETTAGE OF UTERUS     x2  . EXPLORATORY LAPAROTOMY  06/18/2001   with multiple myomectomy  . HYSTEROSCOPY    . PELVIC LAPAROSCOPY    . TONSILLECTOMY  06/18/2001    MEDICATIONS: Current Outpatient Medications on File Prior to Visit  Medication Sig Dispense Refill  . Calcium-Magnesium-Vitamin D (CALCIUM 1200+D3 PO) Take by mouth.    . Multiple Vitamin (MULTIVITAMIN) capsule Take by mouth.    . triamterene-hydrochlorothiazide (DYAZIDE) 37.5-25 MG capsule Take 1 capsule by mouth daily.  3   No current facility-administered medications on file prior to visit.    ALLERGIES: Allergies  Allergen Reactions  . Erythromycin Swelling and Rash    FAMILY HISTORY: Family History  Problem Relation Age of Onset  . Cancer Father        lung  . Breast cancer Maternal Grandmother   . Hypertension Maternal Grandmother   . Diabetes Maternal Grandmother   . Cancer Maternal Aunt 36       uterine cancer-  rare type of ca    SOCIAL HISTORY: Social History   Socioeconomic History  . Marital status: Divorced    Spouse name: Not on file  . Number of children: Not on file  . Years of education: Not on file  . Highest education level: Not on file  Occupational History  . Not on file  Tobacco Use  . Smoking status: Never Smoker  . Smokeless tobacco: Never Used  Vaping Use  . Vaping Use: Never used  Substance and Sexual Activity  . Alcohol use: Yes    Alcohol/week: 0.0 standard drinks    Comment: 3 glasses of wine a year...  . Drug use: No  . Sexual activity: Yes    Birth control/protection: Post-menopausal    Comment: 1st intercourse 54 yo-Fewer than 5 partners  Other Topics Concern  . Not on file  Social History Narrative  . Not on file   Social Determinants of Health   Financial Resource Strain:   . Difficulty of Paying Living Expenses: Not on file  Food Insecurity:   . Worried About Sales executive in the Last Year: Not on file  . Ran Out of Food in the Last Year: Not on file  Transportation Needs:   . Lack of Transportation (Medical): Not on file  . Lack of Transportation (Non-Medical): Not on file  Physical Activity:   . Days of Exercise per Week: Not on file  . Minutes of Exercise per Session: Not on file  Stress:   . Feeling of Stress : Not on file  Social Connections:   . Frequency of Communication with Friends and Family: Not on file  . Frequency of Social Gatherings with Friends and Family: Not on file  . Attends Religious Services: Not on file  . Active Member of Clubs or Organizations: Not on file  . Attends Archivist Meetings: Not on file  . Marital Status: Not on file  Intimate Partner Violence:   . Fear of Current or Ex-Partner: Not on file  . Emotionally Abused: Not on file  . Physically Abused: Not on file  . Sexually Abused: Not on file    PHYSICAL EXAM: Blood pressure 137/88, pulse 88, height 5\' 6"  (1.676 m), weight 201 lb 9.6 oz (91.4 kg), last menstrual period 01/23/2015, SpO2 99 %. General: No acute distress.  Patient appears well-groomed.  Head:  Normocephalic/atraumatic Eyes:  fundi examined but not visualized Neck: supple, no paraspinal tenderness, full range of motion Back: No paraspinal tenderness Heart: regular rate and rhythm Lungs: Clear to auscultation bilaterally. Vascular: No carotid bruits. Neurological Exam: Mental status: alert and oriented to person, place, and time, recent and remote memory intact, fund of knowledge intact, attention and concentration intact, speech fluent and not dysarthric, language intact. Cranial nerves: CN I: not tested CN II: pupils equal, round and reactive to light, visual fields intact CN III, IV, VI:  full range of motion, no nystagmus, no ptosis CN V: facial sensation intact CN VII: upper and lower face symmetric CN VIII: hearing decreased in right ear CN IX, X: gag intact, uvula  midline CN XI: sternocleidomastoid and trapezius muscles intact CN XII: tongue midline Bulk & Tone: normal, no fasciculations. Motor:  5/5 throughout  Sensation:  Pinprick and vibration sensation intact. Deep Tendon Reflexes:  2+ throughout, toes downgoing.  Finger to nose testing:  Without dysmetria.  Heel to shin:  Without dysmetria.  Gait:  Normal station and stride.  Romberg negative.  IMPRESSION: 1.  Constellation of findings on brain MRI suggestive of idiopathic intracranial hypertension but also is nonspecific and may be of no clinical significance.   2.  Sudden hearing loss in left ear  She does not appear to exhibit clinical symptoms of idiopathic intracranial hypertension.  She denies headache, dizziness, blurred vision/visual obscurations.  I do not think the hearing loss is related, as it was unilateral.  She exhibits tinnitus described as static/white noise  rather than pulsatile.  At this time, I will refer her to ophthalmology for formal eye exam to assess for papilledema with visual field testing.  If there are such abnormalities on exam, we will proceed with lumbar puncture and likely start acetazolamide (will need to monitor K+ and kidney function as she already is no a diuretic).  If eye exam is unremarkable, I would not proceed with LP or treatment as there is no clinical way to really follow up appropriate efficacy of medication as she is asymptomatic and without objective signs on eye exam.  If that is the case, I would therefore recommend routine eye exams.  Thank you for allowing me to take part in the care of this patient.  Metta Clines, DO  CC:  Gaynelle Arabian, MD  Vicie Mutters, MD

## 2020-09-17 ENCOUNTER — Ambulatory Visit (INDEPENDENT_AMBULATORY_CARE_PROVIDER_SITE_OTHER): Payer: BC Managed Care – PPO | Admitting: Neurology

## 2020-09-17 ENCOUNTER — Encounter: Payer: Self-pay | Admitting: Neurology

## 2020-09-17 ENCOUNTER — Other Ambulatory Visit: Payer: Self-pay

## 2020-09-17 VITALS — BP 137/88 | HR 88 | Ht 66.0 in | Wt 201.6 lb

## 2020-09-17 DIAGNOSIS — E236 Other disorders of pituitary gland: Secondary | ICD-10-CM

## 2020-09-17 DIAGNOSIS — H903 Sensorineural hearing loss, bilateral: Secondary | ICD-10-CM | POA: Diagnosis not present

## 2020-09-17 NOTE — Patient Instructions (Signed)
MRI findings may suggest increased intracranial pressure.  However, it may be just normal findings for some people.  You do not have symptoms that I think would be seen with this.  I do not think the hearing loss is related to this.  I would like to refer you to ophthalmology for formal eye exam to evaluate for increased pressure behind the eyes and for any significant vision loss on visual field testing.  If positive, then we would proceed with lumbar puncture and medication management.  Otherwise, I would just continue routine eye exams.

## 2020-10-16 DIAGNOSIS — H2513 Age-related nuclear cataract, bilateral: Secondary | ICD-10-CM | POA: Diagnosis not present

## 2020-10-16 DIAGNOSIS — H524 Presbyopia: Secondary | ICD-10-CM | POA: Diagnosis not present

## 2020-10-16 DIAGNOSIS — H5213 Myopia, bilateral: Secondary | ICD-10-CM | POA: Diagnosis not present

## 2020-11-04 DIAGNOSIS — Z0000 Encounter for general adult medical examination without abnormal findings: Secondary | ICD-10-CM | POA: Diagnosis not present

## 2020-11-04 DIAGNOSIS — Z79899 Other long term (current) drug therapy: Secondary | ICD-10-CM | POA: Diagnosis not present

## 2020-11-04 DIAGNOSIS — E7800 Pure hypercholesterolemia, unspecified: Secondary | ICD-10-CM | POA: Diagnosis not present

## 2021-01-04 DIAGNOSIS — Z20828 Contact with and (suspected) exposure to other viral communicable diseases: Secondary | ICD-10-CM | POA: Diagnosis not present

## 2021-03-16 NOTE — Progress Notes (Deleted)
NEUROLOGY FOLLOW UP OFFICE NOTE  Becky Brown [id number]  Assessment/Plan:   1.  Constellation of findings on brain MRI suggestive of idiopathic intracranial hypertension - however such findings may be nonspecific and of no clinical significance.  Given the normal eye exam and that she does not exhibit any symptoms of IIH (headache, dizziness, blurred vision/visual obscurations, pulsatile tinnitus), I would defer lumbar puncture and monitor with routine eye exam.  If she should develop typical symptoms of IIH or exhibit papilledema on eye exam, then I would pursue lumbar puncture.    Subjective:  Becky Brown is a 55 year old right-handed female who follows up for possible idiopathic intracranial hypertension.  UPDATE: She was evaluated by optometrist, Dr. Beatrix Fetters, in November.  Exam demonstrated full visual field testing with no evidence of papilledema.    HISTORY: She reports sudden hearing loss in her left ear after receiving her first dose of the Phizer Covid-19 vaccine on April 1.  About 3 weeks later, she developed a static white nomise sound in her left ear.  She has prior history of severe sensorineural hearing loss in the right ear after receiving the H1N1 vaccine, as demonstrated on audiogram in 2016,with normal hearing in left ear.  Audiogram from April 22 showed mild sensorineural hearing loss in the left ear with 10-30 dB decline in all frequencies since 2016.  She was seen by Dr, Thornell Mule, ENT.  She was prescribed a course of prednisone and the hearing in the left ear improved.  MRI of brain with and without contrast on 04/07/2020 personally reviewed showed partially empty sella suspected tiny cephalocele in the region of the right foramen ovale, and possible transverse sinus narrowing.  Concern for idiopathic intracranial hypertension.  She does not have history of significant headaches, but once in a while she may have pressure in the back of her head lasting a second or  two and occurs about every 2 to 3 months, usually triggered by stress.  She denies any visual disturbance but thinks she may need readers and plans to see an ophthalmologist.  Last eye exam was 2 or 3 years ago.  While she hears a static white noise in her right ear, she does not exhibit any actual pulsatile tinnitus.  No dizziness.    PAST MEDICAL HISTORY: Past Medical History:  Diagnosis Date  . Fibroid   . Hearing loss    right ear    MEDICATIONS: Current Outpatient Medications on File Prior to Visit  Medication Sig Dispense Refill  . Calcium-Magnesium-Vitamin D (CALCIUM 1200+D3 PO) Take by mouth.    . montelukast (SINGULAIR) 10 MG tablet Take 10 mg by mouth daily.    . Multiple Vitamin (MULTIVITAMIN) capsule Take by mouth.    . triamterene-hydrochlorothiazide (DYAZIDE) 37.5-25 MG capsule Take 1 capsule by mouth daily.  3   No current facility-administered medications on file prior to visit.    ALLERGIES: Allergies  Allergen Reactions  . Erythromycin Swelling and Rash    FAMILY HISTORY: Family History  Problem Relation Age of Onset  . Cancer Father        lung  . Breast cancer Maternal Grandmother   . Hypertension Maternal Grandmother   . Diabetes Maternal Grandmother   . Cancer Maternal Aunt 56       uterine cancer-  rare type of ca      Objective:  *** General: No acute distress.  Patient appears ***-groomed.   Head:  Normocephalic/atraumatic Eyes:  Fundi examined  but not visualized Neck: supple, no paraspinal tenderness, full range of motion Heart:  Regular rate and rhythm Lungs:  Clear to auscultation bilaterally Back: No paraspinal tenderness Neurological Exam: alert and oriented to person, place, and time. Attention span and concentration intact, recent and remote memory intact, fund of knowledge intact.  Speech fluent and not dysarthric, language intact.  CN II-XII intact. Bulk and tone normal, muscle strength 5/5 throughout.  Sensation to light touch,  temperature and vibration intact.  Deep tendon reflexes 2+ throughout, toes downgoing.  Finger to nose and heel to shin testing intact.  Gait normal, Romberg negative.     Metta Clines, DO  CC: Gaynelle Arabian, MD

## 2021-03-18 ENCOUNTER — Ambulatory Visit: Payer: BC Managed Care – PPO | Admitting: Neurology

## 2021-04-23 DIAGNOSIS — H9191 Unspecified hearing loss, right ear: Secondary | ICD-10-CM | POA: Diagnosis not present

## 2021-04-23 DIAGNOSIS — H918X2 Other specified hearing loss, left ear: Secondary | ICD-10-CM | POA: Diagnosis not present

## 2021-04-23 DIAGNOSIS — H9041 Sensorineural hearing loss, unilateral, right ear, with unrestricted hearing on the contralateral side: Secondary | ICD-10-CM | POA: Diagnosis not present

## 2021-04-23 DIAGNOSIS — I10 Essential (primary) hypertension: Secondary | ICD-10-CM | POA: Diagnosis not present

## 2021-04-23 DIAGNOSIS — H6121 Impacted cerumen, right ear: Secondary | ICD-10-CM | POA: Diagnosis not present

## 2021-04-23 DIAGNOSIS — H9312 Tinnitus, left ear: Secondary | ICD-10-CM | POA: Diagnosis not present

## 2021-05-28 DIAGNOSIS — Z1231 Encounter for screening mammogram for malignant neoplasm of breast: Secondary | ICD-10-CM | POA: Diagnosis not present

## 2021-08-10 DIAGNOSIS — Z9621 Cochlear implant status: Secondary | ICD-10-CM | POA: Diagnosis not present

## 2021-08-10 DIAGNOSIS — H903 Sensorineural hearing loss, bilateral: Secondary | ICD-10-CM | POA: Diagnosis not present

## 2021-11-10 DIAGNOSIS — Z0000 Encounter for general adult medical examination without abnormal findings: Secondary | ICD-10-CM | POA: Diagnosis not present

## 2021-11-10 DIAGNOSIS — E7800 Pure hypercholesterolemia, unspecified: Secondary | ICD-10-CM | POA: Diagnosis not present

## 2021-11-10 DIAGNOSIS — Z79899 Other long term (current) drug therapy: Secondary | ICD-10-CM | POA: Diagnosis not present

## 2021-11-17 ENCOUNTER — Ambulatory Visit (INDEPENDENT_AMBULATORY_CARE_PROVIDER_SITE_OTHER): Payer: BC Managed Care – PPO | Admitting: Family Medicine

## 2021-11-17 ENCOUNTER — Other Ambulatory Visit: Payer: Self-pay

## 2021-11-17 ENCOUNTER — Ambulatory Visit (HOSPITAL_BASED_OUTPATIENT_CLINIC_OR_DEPARTMENT_OTHER)
Admission: RE | Admit: 2021-11-17 | Discharge: 2021-11-17 | Disposition: A | Discharge status: Home / Self Care | Payer: BC Managed Care – PPO | Source: Ambulatory Visit | Attending: Family Medicine | Admitting: Family Medicine

## 2021-11-17 ENCOUNTER — Ambulatory Visit: Payer: Self-pay

## 2021-11-17 ENCOUNTER — Encounter: Payer: Self-pay | Admitting: Family Medicine

## 2021-11-17 VITALS — BP 138/90 | Ht 66.0 in | Wt 196.0 lb

## 2021-11-17 DIAGNOSIS — M84375A Stress fracture, left foot, initial encounter for fracture: Secondary | ICD-10-CM | POA: Insufficient documentation

## 2021-11-17 DIAGNOSIS — M79672 Pain in left foot: Secondary | ICD-10-CM | POA: Diagnosis not present

## 2021-11-17 DIAGNOSIS — M25872 Other specified joint disorders, left ankle and foot: Secondary | ICD-10-CM | POA: Insufficient documentation

## 2021-11-17 DIAGNOSIS — M76822 Posterior tibial tendinitis, left leg: Secondary | ICD-10-CM

## 2021-11-17 NOTE — Progress Notes (Signed)
Becky Brown - 55 y.o. female MRN [id number]  Date of birth: 1966-07-23  SUBJECTIVE:  Including CC & ROS.  No chief complaint on file.   Becky Brown is a 55 y.o. female that is presenting with left foot pain.  The pain is occurring just distal to the medial malleolus.  Has been walking on her treadmill.  She denies any specific injury.  Pain is worse with walking and weightbearing.    Review of Systems See HPI   HISTORY: Past Medical, Surgical, Social, and Family History Reviewed & Updated per EMR.   Pertinent Historical Findings include:  Past Medical History:  Diagnosis Date   Fibroid    Hearing loss    right ear    Past Surgical History:  Procedure Laterality Date   DILATION AND CURETTAGE OF UTERUS     x2   EXPLORATORY LAPAROTOMY  06/18/2001   with multiple myomectomy   HYSTEROSCOPY     PELVIC LAPAROSCOPY     TONSILLECTOMY  06/18/2001    Family History  Problem Relation Age of Onset   Cancer Father        lung   Breast cancer Maternal Grandmother    Hypertension Maternal Grandmother    Diabetes Maternal Grandmother    Cancer Maternal Aunt 56       uterine cancer-  rare type of ca    Social History   Socioeconomic History   Marital status: Divorced    Spouse name: Not on file   Number of children: Not on file   Years of education: Not on file   Highest education level: Not on file  Occupational History   Not on file  Tobacco Use   Smoking status: Never   Smokeless tobacco: Never  Vaping Use   Vaping Use: Never used  Substance and Sexual Activity   Alcohol use: Yes    Alcohol/week: 0.0 standard drinks    Comment: 3 glasses of wine a year...   Drug use: No   Sexual activity: Yes    Birth control/protection: Post-menopausal    Comment: 1st intercourse 55 yo-Fewer than 5 partners  Other Topics Concern   Not on file  Social History Narrative   Not on file   Social Determinants of Health   Financial Resource Strain: Not on file  Food Insecurity:  Not on file  Transportation Needs: Not on file  Physical Activity: Not on file  Stress: Not on file  Social Connections: Not on file  Intimate Partner Violence: Not on file     PHYSICAL EXAM:  VS: BP 138/90 (BP Location: Left Arm, Patient Position: Sitting)   Ht 5\' 6"  (1.676 m)   Wt 196 lb (88.9 kg)   LMP 01/23/2015   BMI 31.64 kg/m  Physical Exam Gen: NAD, alert, cooperative with exam, well-appearing   Limited ultrasound: Left foot and ankle:  No effusion within the joint. No change of the medial malleolus. Normal-appearing posterior tibialis at the medial malleolus and insertion. There appears to be a hyperechoic change over the medial portion of the talus which could represent more of a stress fracture.  Summary: Findings suggestive of talus stress fracture.  Ultrasound and interpretation by Clearance Coots, MD     ASSESSMENT & PLAN:   Stress fracture of left foot There are changes over the medial portion of the talus which could represent more of a stress fracture versus a impingement.  No suggestion of a inflammatory source -Counseled on home exercise therapy and supportive  care. -X-ray. -Counseled on CAM Walker. -Could consider prednisone if more impingement.

## 2021-11-17 NOTE — Patient Instructions (Signed)
Nice to meet you Please try the boot  I will call with the results.  Please let me know how you're feeling tomorrow   Please send me a message in MyChart with any questions or updates.  Please see me back in 2 weeks.   --Dr. Raeford Razor

## 2021-11-17 NOTE — Assessment & Plan Note (Signed)
There are changes over the medial portion of the talus which could represent more of a stress fracture versus a impingement.  No suggestion of a inflammatory source -Counseled on home exercise therapy and supportive care. -X-ray. -Counseled on CAM Walker. -Could consider prednisone if more impingement.

## 2021-11-18 ENCOUNTER — Telehealth: Payer: Self-pay | Admitting: Family Medicine

## 2021-11-18 MED ORDER — PREDNISONE 5 MG PO TABS: ORAL_TABLET | ORAL | 0 refills | Status: DC

## 2021-11-18 NOTE — Telephone Encounter (Signed)
Patient called states she was told to call back w update on L foot pain --Per pt no change, still hurts (confirms she is wearing the boot)  --Patient also wishes results of X-ray.  --Forwarding message to med asst.  --glh

## 2021-11-18 NOTE — Telephone Encounter (Signed)
Informed of results.   Rosemarie Ax, MD [facility] 11/18/2021, 4:55 PM

## 2021-12-01 ENCOUNTER — Encounter: Payer: Self-pay | Admitting: Family Medicine

## 2021-12-01 ENCOUNTER — Ambulatory Visit (INDEPENDENT_AMBULATORY_CARE_PROVIDER_SITE_OTHER): Payer: BC Managed Care – PPO | Admitting: Family Medicine

## 2021-12-01 DIAGNOSIS — M25872 Other specified joint disorders, left ankle and foot: Secondary | ICD-10-CM | POA: Diagnosis not present

## 2021-12-01 NOTE — Assessment & Plan Note (Signed)
Symptoms seem more consistent with an impingement given her response to the prednisone.  This likely for stress fracture at this time. -Counseled on home exercise therapy and supportive care. -Green sport insoles. -Could consider custom orthotics or physical therapy.

## 2021-12-01 NOTE — Progress Notes (Signed)
°  Becky Brown - 55 y.o. female MRN [id number]  Date of birth: Dec 29, 1965  SUBJECTIVE:  Including CC & ROS.  No chief complaint on file.   Becky Brown is a 55 y.o. female that is following up for her left foot pain.  She has been doing well since using the prednisone.  Having only minimal discomfort..   Review of Systems See HPI   HISTORY: Past Medical, Surgical, Social, and Family History Reviewed & Updated per EMR.   Pertinent Historical Findings include:  Past Medical History:  Diagnosis Date   Fibroid    Hearing loss    right ear    Past Surgical History:  Procedure Laterality Date   DILATION AND CURETTAGE OF UTERUS     x2   EXPLORATORY LAPAROTOMY  06/18/2001   with multiple myomectomy   HYSTEROSCOPY     PELVIC LAPAROSCOPY     TONSILLECTOMY  06/18/2001    Family History  Problem Relation Age of Onset   Cancer Father        lung   Breast cancer Maternal Grandmother    Hypertension Maternal Grandmother    Diabetes Maternal Grandmother    Cancer Maternal Aunt 56       uterine cancer-  rare type of ca    Social History   Socioeconomic History   Marital status: Divorced    Spouse name: Not on file   Number of children: Not on file   Years of education: Not on file   Highest education level: Not on file  Occupational History   Not on file  Tobacco Use   Smoking status: Never   Smokeless tobacco: Never  Vaping Use   Vaping Use: Never used  Substance and Sexual Activity   Alcohol use: Yes    Alcohol/week: 0.0 standard drinks    Comment: 3 glasses of wine a year...   Drug use: No   Sexual activity: Yes    Birth control/protection: Post-menopausal    Comment: 1st intercourse 55 yo-Fewer than 5 partners  Other Topics Concern   Not on file  Social History Narrative   Not on file   Social Determinants of Health   Financial Resource Strain: Not on file  Food Insecurity: Not on file  Transportation Needs: Not on file  Physical Activity: Not on file   Stress: Not on file  Social Connections: Not on file  Intimate Partner Violence: Not on file     PHYSICAL EXAM:  VS: BP 130/84 (BP Location: Left Arm, Patient Position: Sitting)    Ht 5\' 6"  (1.676 m)    Wt 196 lb (88.9 kg)    LMP 01/23/2015    BMI 31.64 kg/m  Physical Exam Gen: NAD, alert, cooperative with exam, well-appearing    ASSESSMENT & PLAN:   Ankle impingement syndrome, left Symptoms seem more consistent with an impingement given her response to the prednisone.  This likely for stress fracture at this time. -Counseled on home exercise therapy and supportive care. -Green sport insoles. -Could consider custom orthotics or physical therapy.

## 2021-12-03 DIAGNOSIS — U071 COVID-19: Secondary | ICD-10-CM | POA: Diagnosis not present

## 2021-12-14 ENCOUNTER — Ambulatory Visit: Payer: 59 | Admitting: Obstetrics & Gynecology

## 2021-12-16 ENCOUNTER — Other Ambulatory Visit: Payer: Self-pay

## 2021-12-16 ENCOUNTER — Other Ambulatory Visit (HOSPITAL_COMMUNITY)
Admission: RE | Admit: 2021-12-16 | Discharge: 2021-12-16 | Disposition: A | Discharge status: Home / Self Care | Payer: BC Managed Care – PPO | Source: Ambulatory Visit | Attending: Obstetrics & Gynecology | Admitting: Obstetrics & Gynecology

## 2021-12-16 ENCOUNTER — Ambulatory Visit (INDEPENDENT_AMBULATORY_CARE_PROVIDER_SITE_OTHER): Payer: BC Managed Care – PPO | Admitting: Obstetrics & Gynecology

## 2021-12-16 ENCOUNTER — Encounter: Payer: Self-pay | Admitting: Obstetrics & Gynecology

## 2021-12-16 VITALS — BP 116/74 | HR 77 | Resp 16 | Ht 64.75 in | Wt 194.0 lb

## 2021-12-16 DIAGNOSIS — Z6832 Body mass index (BMI) 32.0-32.9, adult: Secondary | ICD-10-CM | POA: Diagnosis not present

## 2021-12-16 DIAGNOSIS — Z780 Asymptomatic menopausal state: Secondary | ICD-10-CM

## 2021-12-16 DIAGNOSIS — Z01419 Encounter for gynecological examination (general) (routine) without abnormal findings: Secondary | ICD-10-CM

## 2021-12-16 DIAGNOSIS — E6609 Other obesity due to excess calories: Secondary | ICD-10-CM | POA: Diagnosis not present

## 2021-12-16 NOTE — Progress Notes (Signed)
Becky Brown 11-Feb-1966 [id number]   History:    56 y.o. G3P0A3  RP:  New (>3 years) patient presenting for annual gyn exam   HPI: Postmenopausal, well on no HRT. No PMB. Occasional hot flushes and night sweats.  Will try Estroven/Ashwagandha.  No pelvic pain.  Breasts normal.  Will obtain report of screening mammo in 2022, normal per patient.  BD normal in 2017, will repeat at 56 yo.  Colono normal in 2016, 10 yr schedule.  Health labs with Fam MD.  High LDL, lowering with health diet.  BMI 32.53.  Exercising more.  Past medical history,surgical history, family history and social history were all reviewed and documented in the EPIC chart.  Gynecologic History Patient's last menstrual period was 01/23/2015.  Obstetric History OB History  Gravida Para Term Preterm AB Living  3 0     3 0  SAB IAB Ectopic Multiple Live Births  3            # Outcome Date GA Lbr Len/2nd Weight Sex Delivery Anes PTL Lv  3 SAB           2 SAB           1 SAB              ROS: A ROS was performed and pertinent positives and negatives are included in the history.  GENERAL: No fevers or chills. HEENT: No change in vision, no earache, sore throat or sinus congestion. NECK: No pain or stiffness. CARDIOVASCULAR: No chest pain or pressure. No palpitations. PULMONARY: No shortness of breath, cough or wheeze. GASTROINTESTINAL: No abdominal pain, nausea, vomiting or diarrhea, melena or bright red blood per rectum. GENITOURINARY: No urinary frequency, urgency, hesitancy or dysuria. MUSCULOSKELETAL: No joint or muscle pain, no back pain, no recent trauma. DERMATOLOGIC: No rash, no itching, no lesions. ENDOCRINE: No polyuria, polydipsia, no heat or cold intolerance. No recent change in weight. HEMATOLOGICAL: No anemia or easy bruising or bleeding. NEUROLOGIC: No headache, seizures, numbness, tingling or weakness. PSYCHIATRIC: No depression, no loss of interest in normal activity or change in sleep pattern.      Exam:   BP 116/74    Pulse 77    Resp 16    Ht 5' 4.75" (1.645 m)    Wt 194 lb (88 kg)    LMP 01/23/2015    BMI 32.53 kg/m   Body mass index is 32.53 kg/m.  General appearance : Well developed well nourished female. No acute distress HEENT: Eyes: no retinal hemorrhage or exudates,  Neck supple, trachea midline, no carotid bruits, no thyroidmegaly Lungs: Clear to auscultation, no rhonchi or wheezes, or rib retractions  Heart: Regular rate and rhythm, no murmurs or gallops Breast:Examined in sitting and supine position were symmetrical in appearance, no palpable masses or tenderness,  no skin retraction, no nipple inversion, no nipple discharge, no skin discoloration, no axillary or supraclavicular lymphadenopathy Abdomen: no palpable masses or tenderness, no rebound or guarding Extremities: no edema or skin discoloration or tenderness  Pelvic: Vulva: Normal             Vagina: No gross lesions or discharge  Cervix: No gross lesions or discharge.  Pap reflex done.  Uterus  AV, normal size, shape and consistency, non-tender and mobile  Adnexa  Without masses or tenderness  Anus: Normal   Assessment/Plan:  56 y.o. female for annual exam   1. Encounter for routine gynecological examination with Papanicolaou smear of cervix  Postmenopausal, well on no HRT. No PMB. Occasional hot flushes and night sweats.  Will try Estroven/Ashwagandha.  No pelvic pain.  Breasts normal.  Will obtain report of screening mammo in 2022, normal per patient.  BD normal in 2017, will repeat at 56 yo.  Colono normal in 2016, 10 yr schedule.  Health labs with Fam MD.  High LDL, lowering with health diet.  BMI 32.53.  Exercising more. - Cytology - PAP( [facility])  2. Postmenopause Postmenopausal, well on no HRT. No PMB. Occasional hot flushes and night sweats.  Will try Estroven/Ashwagandha. BD normal in 2017, will repeat at 56 yo.    3. Class 1 obesity due to excess calories with serious comorbidity and body  mass index (BMI) of 32.0 to 32.9 in adult Lower calorie/carb diet.  Increase fitness activities.  Other orders - Multiple Vitamins-Minerals (ZINC PO); Take by mouth.   Princess Bruins MD, 8:20 AM 12/16/2021

## 2021-12-21 LAB — CYTOLOGY - PAP
Diagnosis: NEGATIVE
Diagnosis: REACTIVE

## 2022-02-18 DIAGNOSIS — E7800 Pure hypercholesterolemia, unspecified: Secondary | ICD-10-CM | POA: Diagnosis not present

## 2022-05-20 DIAGNOSIS — H6121 Impacted cerumen, right ear: Secondary | ICD-10-CM | POA: Diagnosis not present

## 2022-05-20 DIAGNOSIS — H9313 Tinnitus, bilateral: Secondary | ICD-10-CM | POA: Diagnosis not present

## 2022-05-20 DIAGNOSIS — H9041 Sensorineural hearing loss, unilateral, right ear, with unrestricted hearing on the contralateral side: Secondary | ICD-10-CM | POA: Diagnosis not present

## 2022-05-20 DIAGNOSIS — E236 Other disorders of pituitary gland: Secondary | ICD-10-CM | POA: Diagnosis not present

## 2023-03-27 ENCOUNTER — Encounter: Payer: Self-pay | Admitting: *Deleted

## 2023-05-30 DIAGNOSIS — Z0110 Encounter for examination of ears and hearing without abnormal findings: Secondary | ICD-10-CM | POA: Diagnosis not present

## 2023-05-30 DIAGNOSIS — H9122 Sudden idiopathic hearing loss, left ear: Secondary | ICD-10-CM | POA: Diagnosis not present

## 2024-01-11 DIAGNOSIS — H938X3 Other specified disorders of ear, bilateral: Secondary | ICD-10-CM | POA: Diagnosis not present

## 2024-01-11 DIAGNOSIS — H903 Sensorineural hearing loss, bilateral: Secondary | ICD-10-CM | POA: Diagnosis not present

## 2024-01-11 DIAGNOSIS — H9311 Tinnitus, right ear: Secondary | ICD-10-CM | POA: Diagnosis not present

## 2024-03-11 ENCOUNTER — Other Ambulatory Visit: Payer: Self-pay

## 2024-03-11 ENCOUNTER — Ambulatory Visit
Admission: EM | Admit: 2024-03-11 | Discharge: 2024-03-11 | Disposition: A | Discharge status: Home / Self Care | Payer: Self-pay | Attending: Family Medicine | Admitting: Family Medicine

## 2024-03-11 DIAGNOSIS — H60391 Other infective otitis externa, right ear: Secondary | ICD-10-CM

## 2024-03-11 MED ORDER — CIPROFLOXACIN-DEXAMETHASONE 0.3-0.1 % OT SUSP: 4.0000 [drp] | Freq: Two times a day (BID) | OTIC | 0 refills | Status: AC

## 2024-03-11 NOTE — ED Provider Notes (Signed)
 [facility]    CSN: [id number] Arrival date & time: 03/11/24  1804      History   Chief Complaint Chief Complaint  Patient presents with   Otalgia    HPI Becky Brown is a 58 y.o. female.   The history is provided by the patient.  Otalgia Right ear tenderness for several days followed by sharp ear pain now has drainage from right ear.  Has longstanding history of hearing loss in that ear, called her ENT doctor who could not see her till the end of April.  Had a slight runny nose last week.  Denies fever, chills, body aches, fatigue, sore throat, cough, injury.  Allergy to erythromycin.  Past Medical History:  Diagnosis Date   Fibroid    Hearing loss    right ear    Patient Active Problem List   Diagnosis Date Noted   Ankle impingement syndrome, left 11/17/2021   De Quervain's tenosynovitis, left 07/25/2016   Laceration of finger of left hand 07/21/2016   Left knee injury 06/10/2016   Injury of left hand 06/10/2016   Menopausal syndrome (hot flushes) 07/24/2015   ASNHL (asymmetrical sensorineural hearing loss) 01/12/2012   Buzzing in ear 01/12/2012    Past Surgical History:  Procedure Laterality Date   DILATION AND CURETTAGE OF UTERUS     x2   EXPLORATORY LAPAROTOMY  06/18/2001   with multiple myomectomy   HYSTEROSCOPY     PELVIC LAPAROSCOPY     TONSILLECTOMY  06/18/2001    OB History     Gravida  3   Para  0   Term      Preterm      AB  3   Living  0      SAB  3   IAB      Ectopic      Multiple      Live Births               Home Medications    Prior to Admission medications   Medication Sig Start Date End Date Taking? Authorizing Provider  ciprofloxacin-dexamethasone (CIPRODEX) OTIC suspension Place 4 drops into the right ear 2 (two) times daily for 7 days. 03/11/24 03/18/24 Yes Meliton Rattan, PA  Calcium-Magnesium-Vitamin D (CALCIUM 1200+D3 PO) Take by mouth.    [provider]  montelukast (SINGULAIR) 10 MG  tablet Take 10 mg by mouth daily. 08/11/20   [provider]  Multiple Vitamin (MULTIVITAMIN) capsule Take by mouth. 02/03/09   [provider]  Multiple Vitamins-Minerals (ZINC PO) Take by mouth.    [provider]  triamterene-hydrochlorothiazide (DYAZIDE) 37.5-25 MG capsule Take 1 capsule by mouth daily. 05/15/16   [provider]    Family History Family History  Problem Relation Age of Onset   Cancer Father        lung   Breast cancer Maternal Grandmother    Hypertension Maternal Grandmother    Diabetes Maternal Grandmother    Cancer Maternal Aunt 58       uterine cancer-  rare type of ca    Social History Social History   Tobacco Use   Smoking status: Never   Smokeless tobacco: Never  Vaping Use   Vaping status: Never Used  Substance Use Topics   Alcohol use: Yes    Comment: 1-2 glasses of wine a month   Drug use: No     Allergies   Erythromycin   Review of Systems Review of Systems  HENT:  Positive for ear pain.      Physical Exam Triage Vital Signs ED Triage Vitals  Encounter Vitals Group     BP 03/11/24 1816 (!) 140/90     Systolic BP Percentile --      Diastolic BP Percentile --      Pulse Rate 03/11/24 1816 98     Resp 03/11/24 1816 20     Temp 03/11/24 1816 97.9 F (36.6 C)     Temp Source 03/11/24 1816 Oral     SpO2 03/11/24 1816 96 %     Weight 03/11/24 1818 201 lb (91.2 kg)     Height 03/11/24 1818 5\' 6"  (1.676 m)     Head Circumference --      Peak Flow --      Pain Score 03/11/24 1817 2     Pain Loc --      Pain Education --      Exclude from Growth Chart --    No data found.  Updated Vital Signs BP (!) 140/90 (BP Location: Right Arm)   Pulse 98   Temp 97.9 F (36.6 C) (Oral)   Resp 20   Ht 5\' 6"  (1.676 m)   Wt 201 lb (91.2 kg)   LMP 01/23/2015   SpO2 96%   BMI 32.44 kg/m   Visual Acuity Right Eye Distance:   Left Eye Distance:   Bilateral Distance:    Right Eye Near:   Left Eye  Near:    Bilateral Near:     Physical Exam Vitals and nursing note reviewed.  Constitutional:      Appearance: Normal appearance.  HENT:     Head: Normocephalic.     Comments: Right canal with yellow-brown drainage TM not visualized    Right Ear: Tenderness present.     Left Ear: Tympanic membrane and ear canal normal.  Cardiovascular:     Rate and Rhythm: Normal rate.  Pulmonary:     Effort: Pulmonary effort is normal. No respiratory distress.  Musculoskeletal:     Cervical back: Neck supple.  Skin:    General: Skin is warm and dry.  Neurological:     Mental Status: She is alert and oriented to person, place, and time.  Psychiatric:        Mood and Affect: Mood normal.      UC Treatments / Results  Labs (all labs ordered are listed, but only abnormal results are displayed) Labs Reviewed - No data to display  EKG   Radiology No results found.  Procedures Procedures (including critical care time)  Medications Ordered in UC Medications - No data to display  Initial Impression / Assessment and Plan / UC Course  I have reviewed the triage vital signs and the nursing notes.  Pertinent labs & imaging results that were available during my care of the patient were reviewed by me and considered in my medical decision making (see chart for details).    58 year old female reports right ear pain and drainage for several days.  Has otitis external on exam Rx Ciprodex sent to pharmacy.  Follow-up with your ENT Final Clinical Impressions(s) / UC Diagnoses   Final diagnoses:  Infective otitis externa of right ear   Discharge Instructions   None    ED Prescriptions     Medication Sig Dispense Auth. Provider   ciprofloxacin-dexamethasone (CIPRODEX) OTIC suspension Place 4 drops into the right ear 2 (two) times daily for 7 days. 7.5 mL Meliton Rattan, [place]  PDMP not reviewed this encounter.   [place], [place], [place] 03/11/24 [phone]

## 2024-03-11 NOTE — ED Triage Notes (Signed)
 Pt presents with complaints of right ear pain/fullness x 6 days. Denies fevers at home. Pt states she noticed brown drainage coming out of ear this morning. Pt currently rates her overall pain a 2/10, aching. OTC decongestant taken last night with little relief.

## 2024-04-11 DIAGNOSIS — L299 Pruritus, unspecified: Secondary | ICD-10-CM | POA: Diagnosis not present

## 2024-04-11 DIAGNOSIS — H61893 Other specified disorders of external ear, bilateral: Secondary | ICD-10-CM | POA: Diagnosis not present

## 2024-04-11 DIAGNOSIS — Z8669 Personal history of other diseases of the nervous system and sense organs: Secondary | ICD-10-CM | POA: Diagnosis not present

## 2024-04-16 DIAGNOSIS — M76822 Posterior tibial tendinitis, left leg: Secondary | ICD-10-CM | POA: Diagnosis not present

## 2024-04-23 DIAGNOSIS — Z1231 Encounter for screening mammogram for malignant neoplasm of breast: Secondary | ICD-10-CM | POA: Diagnosis not present

## 2024-04-23 LAB — HM MAMMOGRAPHY

## 2024-05-13 ENCOUNTER — Ambulatory Visit: Payer: Self-pay | Admitting: Obstetrics and Gynecology

## 2024-05-14 DIAGNOSIS — M76822 Posterior tibial tendinitis, left leg: Secondary | ICD-10-CM | POA: Diagnosis not present

## 2024-06-09 ENCOUNTER — Other Ambulatory Visit: Payer: Self-pay

## 2024-06-09 ENCOUNTER — Ambulatory Visit
Admission: EM | Admit: 2024-06-09 | Discharge: 2024-06-09 | Disposition: A | Discharge status: Home / Self Care | Attending: Physician Assistant | Admitting: Physician Assistant

## 2024-06-09 DIAGNOSIS — H60391 Other infective otitis externa, right ear: Secondary | ICD-10-CM | POA: Diagnosis not present

## 2024-06-09 MED ORDER — CIPROFLOXACIN-DEXAMETHASONE 0.3-0.1 % OT SUSP: 4.0000 [drp] | Freq: Two times a day (BID) | OTIC | 0 refills | Status: AC

## 2024-06-09 NOTE — Discharge Instructions (Addendum)
 You were seen today for concerns of right ear pain.  Your physical exam is consistent with an infection in your ear canal also known as otitis externa. I have sent to medication called Ciprodex  for you to instill in the ear twice per day for 7 days Please try to avoid getting the ear canal read or submerging your head in water as this can cause further infection and contamination in the area If you feel like your symptoms are not improving or seem to be worsening you can return here or follow-up with your PCP for further evaluation If you develop any of the following please return to urgent care or the emergency room as these could be signs of worsening infection: Severe pain of the ear, pain in the surrounding areas such as behind the ear or the jaw, fevers, displacement of the ear due to swelling, severe headaches, copious amounts of drainage and blood from the ear

## 2024-06-09 NOTE — ED Provider Notes (Signed)
 [facility]    CSN: [id number] Arrival date & time: 06/09/24  1014      History   Chief Complaint Chief Complaint  Patient presents with   Otalgia    HPI Becky Brown is a 57 y.o. female.   HPI  Pt presents today for concerns of right ear pain - sharp and shooting  She states this started on Friday  She has tragus pain and pain behind the ear as well She denies drainage or fevers right now She was seen in May for ear infection and tried using ear drops from that encounter but this has not lead to improvement   She denies changes to hearing but states she is mostly deaf in that ear   Past Medical History:  Diagnosis Date   Fibroid    Hearing loss    right ear    Patient Active Problem List   Diagnosis Date Noted   Ankle impingement syndrome, left 11/17/2021   De Quervain's tenosynovitis, left 07/25/2016   Laceration of finger of left hand 07/21/2016   Left knee injury 06/10/2016   Injury of left hand 06/10/2016   Menopausal syndrome (hot flushes) 07/24/2015   ASNHL (asymmetrical sensorineural hearing loss) 01/12/2012   Buzzing in ear 01/12/2012    Past Surgical History:  Procedure Laterality Date   DILATION AND CURETTAGE OF UTERUS     x2   EXPLORATORY LAPAROTOMY  06/18/2001   with multiple myomectomy   HYSTEROSCOPY     PELVIC LAPAROSCOPY     TONSILLECTOMY  06/18/2001    OB History     Gravida  3   Para  0   Term      Preterm      AB  3   Living  0      SAB  3   IAB      Ectopic      Multiple      Live Births               Home Medications    Prior to Admission medications   Medication Sig Start Date End Date Taking? Authorizing Provider  ciprofloxacin -dexamethasone  (CIPRODEX ) OTIC suspension Place 4 drops into the right ear 2 (two) times daily for 7 days. 06/09/24 06/16/24 Yes Graceland Wachter E, PA-C  Calcium-Magnesium-Vitamin D (CALCIUM 1200+D3 PO) Take by mouth.    [provider]  montelukast (SINGULAIR) 10  MG tablet Take 10 mg by mouth daily. 08/11/20   [provider]  Multiple Vitamin (MULTIVITAMIN) capsule Take by mouth. 02/03/09   [provider]  Multiple Vitamins-Minerals (ZINC PO) Take by mouth.    [provider]  triamterene-hydrochlorothiazide (DYAZIDE) 37.5-25 MG capsule Take 1 capsule by mouth daily. 05/15/16   [provider]    Family History Family History  Problem Relation Age of Onset   Cancer Father        lung   Breast cancer Maternal Grandmother    Hypertension Maternal Grandmother    Diabetes Maternal Grandmother    Cancer Maternal Aunt 66       uterine cancer-  rare type of ca    Social History Social History   Tobacco Use   Smoking status: Never   Smokeless tobacco: Never  Vaping Use   Vaping status: Never Used  Substance Use Topics   Alcohol use: Yes    Comment: 1-2 glasses of wine a month   Drug use: No     Allergies  Erythromycin   Review of Systems Review of Systems  Constitutional:  Negative for chills and fever.  HENT:  Positive for ear pain. Negative for ear discharge, hearing loss and sore throat.      Physical Exam Triage Vital Signs ED Triage Vitals  Encounter Vitals Group     BP 06/09/24 1022 (!) 142/91     Girls Systolic BP Percentile --      Girls Diastolic BP Percentile --      Boys Systolic BP Percentile --      Boys Diastolic BP Percentile --      Pulse Rate 06/09/24 1022 69     Resp 06/09/24 1022 18     Temp 06/09/24 1022 98.2 F (36.8 C)     Temp Source 06/09/24 1022 Oral     SpO2 06/09/24 1022 95 %     Weight 06/09/24 1022 198 lb (89.8 kg)     Height 06/09/24 1022 5' 6 (1.676 m)     Head Circumference --      Peak Flow --      Pain Score 06/09/24 1030 6     Pain Loc --      Pain Education --      Exclude from Growth Chart --    No data found.  Updated Vital Signs BP (!) 142/91 (BP Location: Right Arm)   Pulse 69   Temp 98.2 F (36.8 C) (Oral)   Resp 18   Ht 5' 6  (1.676 m)   Wt 198 lb (89.8 kg)   LMP 01/23/2015   SpO2 95%   BMI 31.96 kg/m   Visual Acuity Right Eye Distance:   Left Eye Distance:   Bilateral Distance:    Right Eye Near:   Left Eye Near:    Bilateral Near:     Physical Exam Vitals reviewed.  Constitutional:      General: She is awake.     Appearance: Normal appearance. She is well-developed and well-groomed.  HENT:     Head: Normocephalic and atraumatic.     Right Ear: Drainage, swelling and tenderness present. No laceration. No foreign body.     Left Ear: Hearing, tympanic membrane and ear canal normal.     Ears:     Comments: Pt has significant maceration and drainage of the right ear canal. I am unable to visualize right TM   Eyes:     General: Lids are normal. Gaze aligned appropriately.     Extraocular Movements: Extraocular movements intact.     Conjunctiva/sclera: Conjunctivae normal.   Pulmonary:     Effort: Pulmonary effort is normal.   Neurological:     Mental Status: She is alert and oriented to person, place, and time.   Psychiatric:        Attention and Perception: Attention and perception normal.        Mood and Affect: Mood and affect normal.        Speech: Speech normal.        Behavior: Behavior normal. Behavior is cooperative.      UC Treatments / Results  Labs (all labs ordered are listed, but only abnormal results are displayed) Labs Reviewed - No data to display  EKG   Radiology No results found.  Procedures Procedures (including critical care time)  Medications Ordered in UC Medications - No data to display  Initial Impression / Assessment and Plan / UC Course  I have reviewed the triage vital signs and the nursing notes.  Pertinent labs & imaging results that were available during my care of the patient were reviewed by me and considered in my medical decision making (see chart for details).      Final Clinical Impressions(s) / UC Diagnoses   Final diagnoses:   Infective otitis externa of right ear   Patient presents today with concerns for right ear pain, this been ongoing since Friday.  Physical exam is notable for maceration of the  ear canal and occlusion preventing full visualization of the tympanic membrane.  Physical exam appears consistent with otitis externa.  Will send Ciprodex  otic drops to pharmacy for management.  Reviewed over-the-counter medications as needed for symptomatic management of pain.  ED and return precautions reviewed and provided in after visit summary.  Follow-up as needed    Discharge Instructions      You were seen today for concerns of right ear pain.  Your physical exam is consistent with an infection in your ear canal also known as otitis externa. I have sent to medication called Ciprodex  for you to instill in the ear twice per day for 7 days Please try to avoid getting the ear canal read or submerging your head in water as this can cause further infection and contamination in the area If you feel like your symptoms are not improving or seem to be worsening you can return here or follow-up with your PCP for further evaluation If you develop any of the following please return to urgent care or the emergency room as these could be signs of worsening infection: Severe pain of the ear, pain in the surrounding areas such as behind the ear or the jaw, fevers, displacement of the ear due to swelling, severe headaches, copious amounts of drainage and blood from the ear     ED Prescriptions     Medication Sig Dispense Auth. Provider   ciprofloxacin -dexamethasone  (CIPRODEX ) OTIC suspension Place 4 drops into the right ear 2 (two) times daily for 7 days. 2.8 mL Gedalia Mcmillon E, PA-C      PDMP not reviewed this encounter.   Marylene Rocky BRAVO, PA-C 06/09/24 1358

## 2024-06-09 NOTE — ED Triage Notes (Addendum)
 Pt presents with complaints of right ear pain x 2 days. Leftover antibiotic drops applied to right ear on Friday evening (6/27) with no relief. Describes as sharp, shooting pains that come and go. Hx of right ear infection in May of this year.

## 2024-06-27 DIAGNOSIS — H903 Sensorineural hearing loss, bilateral: Secondary | ICD-10-CM | POA: Diagnosis not present

## 2024-06-27 DIAGNOSIS — H9041 Sensorineural hearing loss, unilateral, right ear, with unrestricted hearing on the contralateral side: Secondary | ICD-10-CM | POA: Diagnosis not present

## 2024-06-27 DIAGNOSIS — Z8669 Personal history of other diseases of the nervous system and sense organs: Secondary | ICD-10-CM | POA: Diagnosis not present

## 2024-06-27 DIAGNOSIS — H608X3 Other otitis externa, bilateral: Secondary | ICD-10-CM | POA: Diagnosis not present

## 2024-07-23 DIAGNOSIS — E7800 Pure hypercholesterolemia, unspecified: Secondary | ICD-10-CM | POA: Diagnosis not present

## 2024-07-23 DIAGNOSIS — H9319 Tinnitus, unspecified ear: Secondary | ICD-10-CM | POA: Diagnosis not present

## 2024-07-23 DIAGNOSIS — Z130 Encounter for screening for diseases of the blood and blood-forming organs and certain disorders involving the immune mechanism: Secondary | ICD-10-CM | POA: Diagnosis not present

## 2024-07-23 DIAGNOSIS — Z1159 Encounter for screening for other viral diseases: Secondary | ICD-10-CM | POA: Diagnosis not present

## 2024-07-25 ENCOUNTER — Other Ambulatory Visit (HOSPITAL_BASED_OUTPATIENT_CLINIC_OR_DEPARTMENT_OTHER): Payer: Self-pay | Admitting: Family Medicine

## 2024-07-25 DIAGNOSIS — Z1159 Encounter for screening for other viral diseases: Secondary | ICD-10-CM | POA: Diagnosis not present

## 2024-07-25 DIAGNOSIS — Z0189 Encounter for other specified special examinations: Secondary | ICD-10-CM | POA: Diagnosis not present

## 2024-07-25 DIAGNOSIS — E7800 Pure hypercholesterolemia, unspecified: Secondary | ICD-10-CM

## 2024-07-25 DIAGNOSIS — Z130 Encounter for screening for diseases of the blood and blood-forming organs and certain disorders involving the immune mechanism: Secondary | ICD-10-CM | POA: Diagnosis not present

## 2024-07-26 ENCOUNTER — Other Ambulatory Visit (HOSPITAL_BASED_OUTPATIENT_CLINIC_OR_DEPARTMENT_OTHER): Payer: Self-pay | Admitting: Family Medicine

## 2024-07-26 DIAGNOSIS — E7800 Pure hypercholesterolemia, unspecified: Secondary | ICD-10-CM

## 2024-07-26 DIAGNOSIS — H9319 Tinnitus, unspecified ear: Secondary | ICD-10-CM

## 2024-08-05 NOTE — Progress Notes (Unsigned)
 58 y.o. G8P0030 Divorced [place] American female here for annual exam.    PCP: Hugh Charleston, MD (Inactive)   Patient's last menstrual period was 01/23/2015.           Sexually active: No.  The current method of family planning is post menopausal status.    Menopausal hormone therapy:  n/a Exercising: {yes no:314532}  {types:19826} Smoker:  no  OB History  Gravida Para Term Preterm AB Living  3 0   3 0  SAB IAB Ectopic Multiple Live Births  3        # Outcome Date GA Lbr Len/2nd Weight Sex Type Anes PTL Lv  3 SAB           2 SAB           1 SAB              HEALTH MAINTENANCE: Last 2 paps:  12/16/21 neg, 04/18/13 neg History of abnormal Pap or positive HPV:  no Mammogram:   04/23/24 Breast Density Cat A, BIRADS Cat 1 neg  Colonoscopy:  2016 normal - 10 year Bone Density:  09/01/16  Result  normal    Immunization History  Administered Date(s) Administered   PFIZER(Purple Top)SARS-COV-2 Vaccination 03/12/2020   Tdap 06/08/2016      reports that she has never smoked. She has never used smokeless tobacco. She reports current alcohol use. She reports that she does not use drugs.  Past Medical History:  Diagnosis Date   Fibroid    Hearing loss    right ear    Past Surgical History:  Procedure Laterality Date   DILATION AND CURETTAGE OF UTERUS     x2   EXPLORATORY LAPAROTOMY  06/18/2001   with multiple myomectomy   HYSTEROSCOPY     PELVIC LAPAROSCOPY     TONSILLECTOMY  06/18/2001    Current Outpatient Medications  Medication Sig Dispense Refill   Calcium-Magnesium-Vitamin D (CALCIUM 1200+D3 PO) Take by mouth.     montelukast (SINGULAIR) 10 MG tablet Take 10 mg by mouth daily.     Multiple Vitamin (MULTIVITAMIN) capsule Take by mouth.     Multiple Vitamins-Minerals (ZINC PO) Take by mouth.     triamterene-hydrochlorothiazide (DYAZIDE) 37.5-25 MG capsule Take 1 capsule by mouth daily.  3   No current facility-administered medications for this visit.     ALLERGIES: Erythromycin  Family History  Problem Relation Age of Onset   Cancer Father        lung   Breast cancer Maternal Grandmother    Hypertension Maternal Grandmother    Diabetes Maternal Grandmother    Cancer Maternal Aunt 46       uterine cancer-  rare type of ca    Review of Systems  PHYSICAL EXAM:  LMP 01/23/2015     General appearance: alert, cooperative and appears stated age Head: normocephalic, without obvious abnormality, atraumatic Neck: no adenopathy, supple, symmetrical, trachea midline and thyroid normal to inspection and palpation Lungs: clear to auscultation bilaterally Breasts: normal appearance, no masses or tenderness, No nipple retraction or dimpling, No nipple discharge or bleeding, No axillary adenopathy Heart: regular rate and rhythm Abdomen: soft, non-tender; no masses, no organomegaly Extremities: extremities normal, atraumatic, no cyanosis or edema Skin: skin color, texture, turgor normal. No rashes or lesions Lymph nodes: cervical, supraclavicular, and axillary nodes normal. Neurologic: grossly normal  Pelvic: External genitalia:  no lesions              No abnormal inguinal  nodes palpated.              Urethra:  normal appearing urethra with no masses, tenderness or lesions              Bartholins and Skenes: normal                 Vagina: normal appearing vagina with normal color and discharge, no lesions              Cervix: no lesions              Pap taken: {yes no:314532} Bimanual Exam:  Uterus:  normal size, contour, position, consistency, mobility, non-tender              Adnexa: no mass, fullness, tenderness              Rectal exam: {yes no:314532}.  Confirms.              Anus:  normal sphincter tone, no lesions  Chaperone was present for exam:  {BSCHAPERONE:31226::Emily F, CMA}  ASSESSMENT: Well woman visit with gynecologic exam.  PHQ-2-9: ***  ***  PLAN: Mammogram screening discussed. Self breast awareness  reviewed. Pap and HRV collected:  {yes no:314532} Guidelines for Calcium, Vitamin D, regular exercise program including cardiovascular and weight bearing exercise. Medication refills:  *** {LABS (Optional):23779} Follow up:  ***    Additional counseling given.  {yes [id number]. ***  total time was spent for this patient encounter, including preparation, face-to-face counseling with the patient, coordination of care, and documentation of the encounter in addition to doing the well woman visit with gynecologic exam.

## 2024-08-06 ENCOUNTER — Ambulatory Visit (INDEPENDENT_AMBULATORY_CARE_PROVIDER_SITE_OTHER): Admitting: Obstetrics and Gynecology

## 2024-08-06 ENCOUNTER — Encounter: Payer: Self-pay | Admitting: Obstetrics and Gynecology

## 2024-08-06 VITALS — BP 118/84 | HR 79 | Ht 66.0 in | Wt 198.0 lb

## 2024-08-06 DIAGNOSIS — Z1331 Encounter for screening for depression: Secondary | ICD-10-CM | POA: Diagnosis not present

## 2024-08-06 DIAGNOSIS — Z01419 Encounter for gynecological examination (general) (routine) without abnormal findings: Secondary | ICD-10-CM | POA: Diagnosis not present

## 2024-08-06 NOTE — Patient Instructions (Signed)

## 2024-09-12 ENCOUNTER — Ambulatory Visit (HOSPITAL_BASED_OUTPATIENT_CLINIC_OR_DEPARTMENT_OTHER)
Admission: RE | Admit: 2024-09-12 | Discharge: 2024-09-12 | Disposition: A | Discharge status: Home / Self Care | Payer: Self-pay | Source: Ambulatory Visit | Attending: Family Medicine | Admitting: Family Medicine

## 2024-09-12 DIAGNOSIS — E7800 Pure hypercholesterolemia, unspecified: Secondary | ICD-10-CM | POA: Insufficient documentation

## 2024-09-12 DIAGNOSIS — H9319 Tinnitus, unspecified ear: Secondary | ICD-10-CM | POA: Insufficient documentation

## 2024-09-19 ENCOUNTER — Ambulatory Visit (HOSPITAL_BASED_OUTPATIENT_CLINIC_OR_DEPARTMENT_OTHER)

## 2025-08-11 ENCOUNTER — Ambulatory Visit: Admitting: Obstetrics and Gynecology
